# Patient Record
Sex: Female | Born: 1970 | Marital: Married | Smoking: Never smoker
Health system: Southern US, Community
[De-identification: ages and names within clinical notes are randomized; demographics above are authoritative.]

## PROBLEM LIST (undated history)

## (undated) DIAGNOSIS — F419 Anxiety disorder, unspecified: Secondary | ICD-10-CM

## (undated) DIAGNOSIS — R42 Dizziness and giddiness: Secondary | ICD-10-CM

## (undated) DIAGNOSIS — H9209 Otalgia, unspecified ear: Secondary | ICD-10-CM

## (undated) DIAGNOSIS — K529 Noninfective gastroenteritis and colitis, unspecified: Secondary | ICD-10-CM

## (undated) DIAGNOSIS — K589 Irritable bowel syndrome without diarrhea: Secondary | ICD-10-CM

## (undated) DIAGNOSIS — K2970 Gastritis, unspecified, without bleeding: Secondary | ICD-10-CM

## (undated) HISTORY — DX: Anxiety disorder, unspecified: F41.9

## (undated) HISTORY — DX: Noninfective gastroenteritis and colitis, unspecified: K52.9

## (undated) HISTORY — DX: Otalgia, unspecified ear: H92.09

---
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
## 2019-01-10 DIAGNOSIS — H6981 Other specified disorders of Eustachian tube, right ear: Secondary | ICD-10-CM | POA: Insufficient documentation

## 2019-01-10 DIAGNOSIS — M26623 Arthralgia of bilateral temporomandibular joint: Secondary | ICD-10-CM | POA: Insufficient documentation

## 2019-01-21 ENCOUNTER — Other Ambulatory Visit: Payer: Self-pay | Admitting: Otolaryngology

## 2019-01-21 DIAGNOSIS — H9201 Otalgia, right ear: Secondary | ICD-10-CM

## 2019-01-24 ENCOUNTER — Ambulatory Visit
Admission: RE | Admit: 2019-01-24 | Discharge: 2019-01-24 | Disposition: A | Discharge status: Home / Self Care | Payer: 59 | Source: Ambulatory Visit | Attending: Otolaryngology | Admitting: Otolaryngology

## 2019-01-24 ENCOUNTER — Other Ambulatory Visit: Payer: Self-pay

## 2019-01-24 DIAGNOSIS — H9201 Otalgia, right ear: Secondary | ICD-10-CM

## 2019-01-24 MED ORDER — IOPAMIDOL (ISOVUE-370) INJECTION 76%
75.0000 mL | Freq: Once | INTRAVENOUS | Status: AC | PRN
  Administered 2019-01-24: 75 mL via INTRAVENOUS

## 2019-01-31 DIAGNOSIS — F411 Generalized anxiety disorder: Secondary | ICD-10-CM | POA: Insufficient documentation

## 2019-02-01 ENCOUNTER — Other Ambulatory Visit: Payer: Self-pay

## 2019-02-01 ENCOUNTER — Ambulatory Visit (INDEPENDENT_AMBULATORY_CARE_PROVIDER_SITE_OTHER): Payer: 59 | Admitting: Neurology

## 2019-02-01 ENCOUNTER — Telehealth: Payer: Self-pay

## 2019-02-01 ENCOUNTER — Encounter: Payer: Self-pay | Admitting: Neurology

## 2019-02-01 DIAGNOSIS — H9201 Otalgia, right ear: Secondary | ICD-10-CM | POA: Diagnosis not present

## 2019-02-01 NOTE — Telephone Encounter (Signed)
I contacted the pt in regards to her video visit scheduled for 1:30 on 02/01/19.  Due to current COVID 19 pandemic, our office is severely reducing in office visits for at least the next 2 weeks, in order to minimize the risk to our patients and healthcare providers.    Pt understands that although there may be some limitations with this type of visit, we will take all precautions to reduce any security or privacy concerns.  Pt understands that this will be treated like an in office visit and we will file with pt's insurance, and there may be a patient responsible charge related to this service.  Pt's email is [email or web address]. Pt understands that the [employer] webex software must be downloaded and operational on the device pt plans to use for the visit.   I completed the pre charting for visit.

## 2019-02-01 NOTE — Progress Notes (Signed)
Virtual Visit via Video Note  I connected with Chelsea Bradshaw on 02/01/19 at  1:30 PM EDT by a video enabled telemedicine application and verified that I am speaking with the correct person using two identifiers.   I discussed the limitations of evaluation and management by telemedicine and the availability of in person appointments. The patient expressed understanding and agreed to proceed.  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  The patient has had onset around 05 September 2018 of right-sided ear pain with some itching and rale sensations going down the throat on that side.  The patient has undergone an extensive work-up in the past that includes MRI of the brain and MRI of the cervical spine that were unremarkable.  The patient has had a tympanogram that was normal, the possibility of eustachian tube dysfunction was entertained.  The patient does have some clicking in the temporomandibular joints bilaterally and it is felt that she may have some TMJ dysfunction issues.  The patient reports that she does not have increased pressure and pain in the right ear and throat with talking or chewing.  She denies any neck pain or neck discomfort.  She has not had any change in hearing or any changes in speech or swallowing.  She sometimes feels as if something is stuck in her throat.  She denies any vision changes.  She has not had any numbness or weakness of the face, arms, or legs.  She denies any balance issues or difficulty controlling the bowels or the bladder.  Within the last 3 days she was started on amitriptyline and clonazepam, she believes that she is already getting some benefit with this combination.  She does have gastroesophageal reflux disease however.  The patient did undergo CT angiogram of the head and neck done by Dr. Polly Cobia, this was unremarkable.    Observations/Objective: The WebEx evaluation reveals that the patient is alert and cooperative, she is answering questions appropriately.  Full extraocular movements were seen.  The patient has a symmetric smile, is able to protrude the tongue in the midline.  She seems to be able to open her mouth normally.  Full range of movement of the cervical spine was noted.  The patient is able to perform finger-nose-finger and heel-to-shin bilaterally.  Gait is normal.  Tandem gait is normal.  Romberg is negative.  Assessment and Plan: 1.  Right ear and throat discomfort  The etiology of the ear discomfort is not clear, the patient has had negative evaluation of the head and neck.  She does report clicking in the temporomandibular joints and may have TMJ dysfunction, but the description of her pain and distribution of pain is somewhat atypical for this.  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  The use of amitriptyline is appropriate at this time, if she cannot tolerate this secondary to gastroesophageal reflux disease we may switch to gabapentin.  The patient will follow-up here in 4 months, she will call me in 2 weeks to go of her progress.  Follow Up Instructions: Follow-up with me in 4 months.   I discussed the assessment and treatment plan with the patient. The patient was provided an opportunity to ask questions and all were answered. The patient agreed with the plan and demonstrated an understanding of the instructions.   The  patient was advised to call back or seek an in-person evaluation if the symptoms worsen or if the condition fails to improve as anticipated.  I provided 30 minutes of non-face-to-face time during this encounter.   Kathrynn Ducking, MD

## 2019-02-09 NOTE — Telephone Encounter (Addendum)
Patient send a my chart message on 02/05/19 and 02/08/19 asking for Dr. Jannifer Franklin to review. Pt was advised on 02/07/19 Dr. Jannifer Franklin would be out of the clinic until 02/09/19.

## 2019-02-10 ENCOUNTER — Other Ambulatory Visit: Payer: Self-pay | Admitting: Neurology

## 2019-02-10 MED ORDER — GABAPENTIN 100 MG PO CAPS: 100.0000 mg | ORAL_CAPSULE | Freq: Three times a day (TID) | ORAL | 3 refills | Status: DC

## 2019-02-18 DIAGNOSIS — F418 Other specified anxiety disorders: Secondary | ICD-10-CM | POA: Insufficient documentation

## 2019-02-18 DIAGNOSIS — K589 Irritable bowel syndrome without diarrhea: Secondary | ICD-10-CM | POA: Insufficient documentation

## 2019-02-23 ENCOUNTER — Encounter (HOSPITAL_COMMUNITY): Payer: Self-pay

## 2019-02-23 ENCOUNTER — Other Ambulatory Visit: Payer: Self-pay

## 2019-02-23 ENCOUNTER — Telehealth: Payer: Self-pay | Admitting: Neurology

## 2019-02-23 ENCOUNTER — Emergency Department (HOSPITAL_COMMUNITY)
Admission: EM | Admit: 2019-02-23 | Discharge: 2019-02-23 | Disposition: A | Discharge status: Home / Self Care | Payer: 59 | Attending: Emergency Medicine | Admitting: Emergency Medicine

## 2019-02-23 DIAGNOSIS — R112 Nausea with vomiting, unspecified: Secondary | ICD-10-CM

## 2019-02-23 DIAGNOSIS — Z79899 Other long term (current) drug therapy: Secondary | ICD-10-CM | POA: Diagnosis not present

## 2019-02-23 DIAGNOSIS — K589 Irritable bowel syndrome without diarrhea: Secondary | ICD-10-CM | POA: Insufficient documentation

## 2019-02-23 DIAGNOSIS — R1084 Generalized abdominal pain: Secondary | ICD-10-CM | POA: Insufficient documentation

## 2019-02-23 HISTORY — DX: Gastritis, unspecified, without bleeding: K29.70

## 2019-02-23 HISTORY — DX: Irritable bowel syndrome, unspecified: K58.9

## 2019-02-23 MED ORDER — DIPHENHYDRAMINE HCL 25 MG PO TABS: 25.0000 mg | ORAL_TABLET | Freq: Four times a day (QID) | ORAL | 0 refills | Status: DC | PRN

## 2019-02-23 MED ORDER — DIPHENHYDRAMINE HCL 50 MG/ML IJ SOLN
25.0000 mg | Freq: Once | INTRAMUSCULAR | Status: AC
  Administered 2019-02-23: 25 mg via INTRAVENOUS
  Filled 2019-02-23: qty 1

## 2019-02-23 MED ORDER — METOCLOPRAMIDE HCL 5 MG/ML IJ SOLN
10.0000 mg | Freq: Once | INTRAMUSCULAR | Status: AC
  Administered 2019-02-23: 10 mg via INTRAVENOUS
  Filled 2019-02-23: qty 2

## 2019-02-23 MED ORDER — SODIUM CHLORIDE 0.9 % IV BOLUS
1000.0000 mL | Freq: Once | INTRAVENOUS | Status: AC
  Administered 2019-02-23: 1000 mL via INTRAVENOUS

## 2019-02-23 MED ORDER — METOCLOPRAMIDE HCL 10 MG PO TABS: 10.0000 mg | ORAL_TABLET | Freq: Four times a day (QID) | ORAL | 0 refills | Status: DC | PRN

## 2019-02-23 NOTE — ED Triage Notes (Signed)
Pt has been experiencing ongoing nausea, bloating and indigestion for one month and is being treated for this by her PCP.   Last night pt developed generalized abd pain after administering a phenergan suppository.  Pt reports not having any pain with this before.  Pt has been treated for H-pylori 3 times and states that she "felt worse" after treatment.  Last treatment for H-pylori was 2018 and recent test yesterday was negative.

## 2019-02-23 NOTE — Discharge Instructions (Signed)
Stop taking the phenergan. Continue the pepcid, lexapro and amitriptyline. Begin taking the reglan/metoclopramide every 6 hours as needed for nausea.  Take benadryl with this medication. Attend your appointment tomorrow with the GI specialist.

## 2019-02-23 NOTE — Telephone Encounter (Signed)
Called pts x2 to schedule f/u no VM set up at this time

## 2019-02-23 NOTE — ED Provider Notes (Signed)
[facility] EMERGENCY DEPARTMENT Provider Note   CSN: [id number] Arrival date & time: 02/23/19  1016    History   Chief Complaint Chief Complaint  Patient presents with  . Abdominal Pain    HPI Chelsea Bradshaw is a 48 y.o. female with past medical history of gastritis, IBS, anxiety, presenting to the emergency department with complaint of ongoing nausea and vomiting.  Patient has longstanding history of gastritis and IBS.  She states she was previously treated in [place], from which she just moved to the [place] in the end of February.  She has a new GI appointment tomorrow and has been followed by her PCP.  Over the past month, her PCP has been changing around medications for her symptoms, including a trial of Prevacid, Pepcid, and is currently taking Phenergan suppositories.  She is also on Lexapro, per PCP documentation it is felt that there is a large anxiety component to her abdominal symptoms.  Patient has history of H. pylori, however negative test this month on the 13th.  She also had labs done 2 days ago by PCP and are unremarkable.  She states the Phenergan has not been helping enough.  She is constantly nauseous with intermittent episodes of nonbloody nonbilious emesis, episode of emesis was >24 hours ago.  No coffee-ground emesis.  Yesterday she developed a generalized intermittent crampy abdominal pain which is rather new for her.  She feels bloated and gassy. She reports today for symptom control.     The history is provided by the patient and medical records.    Past Medical History:  Diagnosis Date  . Ear pain    right some times in the left   . Gastritis   . IBS (irritable bowel syndrome)     There are no active problems to display for this patient.   Past Surgical History:  Procedure Laterality Date  . CESAREAN SECTION       OB History   No obstetric history on file.      Home Medications    Prior to Admission medications    Medication Sig Start Date End Date Taking? Authorizing Provider  amitriptyline (ELAVIL) 10 MG tablet  01/27/19   [provider]  clonazePAM (KLONOPIN) 0.5 MG tablet 0.5 mg 3 daily 01/25/19   [provider]  diphenhydrAMINE (BENADRYL) 25 MG tablet Take 1 tablet (25 mg total) by mouth every 6 (six) hours as needed (take with metoclopramide/reglan). 02/23/19   Leonor Darnell, [place] N, PA-C  famotidine (PEPCID) 20 MG tablet Take by mouth. 01/24/19   [provider]  gabapentin (NEURONTIN) 100 MG capsule Take 1 capsule (100 mg total) by mouth 3 (three) times daily. 02/10/19   Kathrynn Ducking, MD  metoCLOPramide (REGLAN) 10 MG tablet Take 1 tablet (10 mg total) by mouth every 6 (six) hours as needed for nausea or vomiting. 02/23/19   Kloey Cazarez, [place] N, PA-C  ondansetron (ZOFRAN-ODT) 4 MG disintegrating tablet TAKE ONE TABLET (4 MG DOSE) BY MOUTH EVERY 8 (EIGHT) HOURS AS NEEDED FOR NAUSEA FOR UP TO 7 DAYS. 01/24/19   [provider]    Family History Family History  Problem Relation Age of Onset  . Healthy Mother   . Healthy Father     Social History Social History   Tobacco Use  . Smoking status: Never Smoker  . Smokeless tobacco: Never Used  Substance Use Topics  . Alcohol use: Never    Frequency: Never  . Drug use: Never  Allergies   Patient has no known allergies.   Review of Systems Review of Systems  Constitutional: Negative for fever.  Gastrointestinal: Positive for abdominal pain, nausea and vomiting. Negative for diarrhea.  Genitourinary: Negative for dysuria and frequency.  All other systems reviewed and are negative.    Physical Exam Updated Vital Signs BP 101/73 (BP Location: Right Arm)   Pulse 82   Temp 97.8 F (36.6 C) (Oral)   Resp 18   Ht 5\' 3"  (1.6 m)   Wt 56.2 kg   LMP 02/17/2019 (Exact Date)   SpO2 100%   BMI 21.97 kg/m   Physical Exam Vitals signs and nursing note reviewed.  Constitutional:      General: She is  not in acute distress.    Appearance: She is well-developed.  HENT:     Head: Normocephalic and atraumatic.     Mouth/Throat:     Mouth: Mucous membranes are moist.  Eyes:     Conjunctiva/sclera: Conjunctivae normal.  Cardiovascular:     Rate and Rhythm: Normal rate and regular rhythm.  Pulmonary:     Effort: Pulmonary effort is normal. No respiratory distress.     Breath sounds: Normal breath sounds.  Abdominal:     General: Bowel sounds are normal.     Palpations: Abdomen is soft. There is no mass.     Tenderness: There is no abdominal tenderness. There is no guarding or rebound.     Comments: Abdomen appears very slightly distended.  Skin:    General: Skin is warm.  Neurological:     Mental Status: She is alert.  Psychiatric:        Behavior: Behavior normal.      ED Treatments / Results  Labs (all labs ordered are listed, but only abnormal results are displayed) Labs Reviewed - No data to display  EKG None  Radiology No results found.  Procedures Procedures (including critical care time)  Medications Ordered in ED Medications  sodium chloride 0.9 % bolus 1,000 mL (0 mLs Intravenous Stopped 02/23/19 1213)  metoCLOPramide (REGLAN) injection 10 mg (10 mg Intravenous Given 02/23/19 1118)  diphenhydrAMINE (BENADRYL) injection 25 mg (25 mg Intravenous Given 02/23/19 1117)     Initial Impression / Assessment and Plan / ED Course  I have reviewed the triage vital signs and the nursing notes.  Pertinent labs & imaging results that were available during my care of the patient were reviewed by me and considered in my medical decision making (see chart for details).       Patient with ongoing history of gastritis and IBS, with current 1 month long episode of nausea and vomiting with heartburn.  Patient presenting today with persistent nausea and intermittent vomiting, as well as generalized abdominal cramping/gas pains.  Patient presents today for symptom relief.  She  does have a GI appointment tomorrow.  No fevers.  Abdomen is soft and nontender on exam.  Vital signs are stable.  Labs done by PCP 2 days ago are unremarkable.  Patient also had negative H. pylori test done this month.  At this time will provide IV hydration with Reglan and Benadryl for symptom relief.  On evaluation, patient reports improvement in symptoms.  Nausea is resolved.  Will discharge with prescription for Reglan, instructed to discontinue Phenergan.  Instructed patient to attend her appointment tomorrow for further evaluation of her ongoing symptoms.  Low suspicion for acute intra-abdominal pathology given reassuring exam and recent normal outpatient work-up.  Discharge with strict return  precautions.  Agreeable to plan and safe for discharge.  Discussed results, findings, treatment and follow up. Patient advised of return precautions. Patient verbalized understanding and agreed with plan.  Final Clinical Impressions(s) / ED Diagnoses   Final diagnoses:  Generalized abdominal pain  Non-intractable vomiting with nausea, unspecified vomiting type    ED Discharge Orders         Ordered    metoCLOPramide (REGLAN) 10 MG tablet  Every 6 hours PRN     02/23/19 1232    diphenhydrAMINE (BENADRYL) 25 MG tablet  Every 6 hours PRN     02/23/19 1232           Jaskarn Schweer, [place] N, PA-C 02/23/19 1433    Carmin Muskrat, MD 02/23/19 1754

## 2019-02-24 ENCOUNTER — Ambulatory Visit (INDEPENDENT_AMBULATORY_CARE_PROVIDER_SITE_OTHER): Payer: 59 | Admitting: Gastroenterology

## 2019-02-24 ENCOUNTER — Encounter: Payer: Self-pay | Admitting: Gastroenterology

## 2019-02-24 VITALS — Ht 63.0 in | Wt 120.0 lb

## 2019-02-24 DIAGNOSIS — R1084 Generalized abdominal pain: Secondary | ICD-10-CM

## 2019-02-24 DIAGNOSIS — F411 Generalized anxiety disorder: Secondary | ICD-10-CM | POA: Diagnosis not present

## 2019-02-24 DIAGNOSIS — R112 Nausea with vomiting, unspecified: Secondary | ICD-10-CM | POA: Diagnosis not present

## 2019-02-24 DIAGNOSIS — R140 Abdominal distension (gaseous): Secondary | ICD-10-CM

## 2019-02-24 MED ORDER — DICYCLOMINE HCL 10 MG PO CAPS: 10.0000 mg | ORAL_CAPSULE | Freq: Three times a day (TID) | ORAL | 3 refills | Status: DC | PRN

## 2019-02-24 MED ORDER — SUCRALFATE 1 G PO TABS: 1.0000 g | ORAL_TABLET | Freq: Four times a day (QID) | ORAL | 2 refills | Status: DC | PRN

## 2019-02-24 NOTE — Patient Instructions (Addendum)
If you are age 48 or older, your body mass index should be between 23-30. Your Body mass index is 21.26 kg/m. If this is out of the aforementioned range listed, please consider follow up with your Primary Care Provider.  If you are age 7 or younger, your body mass index should be between 19-25. Your Body mass index is 21.26 kg/m. If this is out of the aformentioned range listed, please consider follow up with your Primary Care Provider.   To help prevent the possible spread of infection to our patients, communities, and staff; we will be implementing the following measures:  As of now we are not allowing any visitors/family members to accompany you to any upcoming appointments with [facility]. If you have any concerns about this please contact our office to discuss prior to the appointment.   Take phenergan 12.'5mg'$  three times a day for Nausea.  We have sent the following medications to your pharmacy for you to pick up at your convenience:: Carafate: Take 1 tablet by mouth, every 6 hours as needed  Bentyl: Take every 8 hours as needed  Continue Pepcid.      You have been scheduled for a CT scan of the abdomen and pelvis at [facility] located at [address] You will need to pick up 2 bottles of contrast next week between the hours of 8:00am and 5:00pm.  You are scheduled for your CT on Friday, 03-04-19 at 10:00am. You should arrive 20 minutes prior to your appointment time for registration. Please follow the written instructions below on the day of your exam:  WARNING: IF YOU ARE ALLERGIC TO IODINE/X-RAY DYE, PLEASE NOTIFY RADIOLOGY IMMEDIATELY AT [phone]! YOU WILL BE GIVEN A 13 HOUR PREMEDICATION PREP.  1) Do not eat anything after 6:00am (4 hours prior to your test) 2) You need to pick up 2 bottles of oral contrast to drink. The solution may taste better if refrigerated, but do NOT add ice or any other liquid to this solution. Shake well before  drinking.    Drink 1 bottle of contrast @ 8:00am (2 hours prior to your exam)  Drink 1 bottle of contrast @ 9:00am (1 hour prior to your exam)  You may take any medications as prescribed with a small amount of water, if necessary. If you take any of the following medications: METFORMIN, GLUCOPHAGE, GLUCOVANCE, AVANDAMET, RIOMET, FORTAMET, [place] MET, JANUMET, GLUMETZA or METAGLIP, you MAY be asked to HOLD this medication 48 hours AFTER the exam.  The purpose of you drinking the oral contrast is to aid in the visualization of your intestinal tract. The contrast solution may cause some diarrhea. Depending on your individual set of symptoms, you may also receive an intravenous injection of x-ray contrast/dye.   This test typically takes 30-45 minutes to complete.  If you have any questions regarding your exam or if you need to reschedule, you may call [facility] at [phone] between the hours of 8:00 am and 5:00 pm, Monday-Friday.  _______________________________________________________________   Thank you for entrusting me with your care and for choosing [facility], Dr. [place] Cellar

## 2019-02-24 NOTE — Progress Notes (Signed)
Virtual Visit via Video Note  I connected with Chelsea Bradshaw on 02/24/19 at  3:00 PM EDT by a video enabled telemedicine application and verified that I am speaking with the correct person using two identifiers.   I discussed the limitations of evaluation and management by telemedicine and the availability of in person appointments. The patient expressed understanding and agreed to proceed.  THIS ENCOUNTER IS A VIRTUAL VISIT DUE TO COVID-19 - PATIENT WAS NOT SEEN IN THE OFFICE. PATIENT HAS CONSENTED TO VIRTUAL VISIT / TELEMEDICINE VISIT USING DOXIMITY   Location of patient: home Location of provider: office Name of referring provider: self referred Persons participating: myself, patient   HPI :  48 y/o female here for a new patient visit regarding nausea / vomiting / abdominal pain, self referred.  She reports since March she has had some problems with her stomach, worse in the past week or so.   She feels severely nausea, bloating in her abdomen diffusely. She had 2 episodes of vomiting when she awoke last week although usually does not vomit. She has poor appetite. Nausea is worst in the morning, slightly better in the evening. She has a burning sensation in the epigastric area, some pyrosis with this. No dysphagia but some discomfort in the epigastric area after eating.  She reports she has some hard stools and loose stools, goes back and forth between these. She reports she saw some "white chaulk" in her stools, denies anything that looked like a parasite.   She reports she has prolonged episodes of these symptoms in the past. She moved from [place] about a 1 month ago. She reports she had similar symptoms in 2017, had H pylori diagnosed, and got worse with therapy of H pylori. In 2018 she moved to [place] had similar symptoms at that time. She had another endoscopy and found H pylori again and was treated. Another episode occured in 2019 with similar symptoms, but this time she  feels much worse in regards to her nausea. She has had a prior CT scan in 2017 which looked okay from her report. She has had many ultrasounds in [place] which she does not think showed any gallstones, but perhaps a small gallbladder polyp? It was not thought the gallbladder was causing symptoms.   Nausea, postprandial discomfort in the upper abdomen, bloating is what bothers her the most intermittently. She does not feel improvement with a bowel movement. No blood in the stools.   No NSAIDs. She has been on both phenergan and Zofran without benefit but taking PRN. She was seen in the ED last night, given fluids and reglan which she states did not help and made her feel worse. She states antibiotics for H pylori in the past made her feel worse. She had an H pylori breath test done recently which was negative. CBC, LFTs, BMET, all normal.   She has significant anxiety. She has lived here about a month or so due to husband's job. She states she usually Lexapro has made her feel better in the past. She has been started on Lexapro a few days ago, 10mg , as well as Pepcid. Prevacid and other PPIs have made her feel worse. In between these prolonged episodes she has no symptoms at all. She thinks she has lost about 1 kg thus far over the past month.   She recently has been taking Lexapro, Elavil, klonopin. She thinks the klonopin does help. Also taking pepcid. She does remotely endorse that domperidone given to her  in the past may have helped. She is not sure if she has had a prior gastric emptying study.  H  Breath test negative 02/07/19  She has had a colonoscopy in 2017 which was normal  She is on Elavil for which she takes 10mg  for ear pain which has been difficult to clarify what is driving this. She has seen numerous specialist for chronic ear pain, has had multiple MRIs and CTA of the neck which has been negative. She denies any vertigo     Past Medical History:  Diagnosis Date   Ear pain      right some times in the left    Gastritis    IBS (irritable bowel syndrome)      Past Surgical History:  Procedure Laterality Date   CESAREAN SECTION     Family History  Problem Relation Age of Onset   Healthy Mother    Healthy Father    Social History   Tobacco Use   Smoking status: Never Smoker   Smokeless tobacco: Never Used  Substance Use Topics   Alcohol use: Never    Frequency: Never   Drug use: Never   Current Outpatient Medications  Medication Sig Dispense Refill   amitriptyline (ELAVIL) 10 MG tablet      clonazePAM (KLONOPIN) 0.5 MG tablet 0.5 mg 3 daily     escitalopram (LEXAPRO) 10 MG tablet Take by mouth.     famotidine (PEPCID) 20 MG tablet Take by mouth.     ondansetron (ZOFRAN-ODT) 4 MG disintegrating tablet TAKE ONE TABLET (4 MG DOSE) BY MOUTH EVERY 8 (EIGHT) HOURS AS NEEDED FOR NAUSEA FOR UP TO 7 DAYS.     diphenhydrAMINE (BENADRYL) 25 MG tablet Take 1 tablet (25 mg total) by mouth every 6 (six) hours as needed (take with metoclopramide/reglan). (Patient not taking: Reported on 02/24/2019) 30 tablet 0   gabapentin (NEURONTIN) 100 MG capsule Take 1 capsule (100 mg total) by mouth 3 (three) times daily. (Patient not taking: Reported on 02/24/2019) 90 capsule 3   metoCLOPramide (REGLAN) 10 MG tablet Take 1 tablet (10 mg total) by mouth every 6 (six) hours as needed for nausea or vomiting. (Patient not taking: Reported on 02/24/2019) 30 tablet 0   promethazine (PHENERGAN) 25 MG suppository Place rectally.     No current facility-administered medications for this visit.    No Known Allergies   Review of Systems: All systems reviewed and negative except where noted in HPI.    No results found.  Physical Exam: Ht 5\' 3"  (1.6 m)    Wt 120 lb (54.4 kg)    LMP 02/17/2019 (Exact Date)    BMI 21.26 kg/m  NA   ASSESSMENT AND PLAN:  48 y/o female self referred her for a new patient visit regarding the following:  Nausea / abdominal  discomfort / bloating / anxiety - extensive history as above, patient with a prolonged episodes of severe nausea, abdominal discomfort / bloating during stressful times in her life over the past few years. She has had an extensive workup as above with multiple EGDs, colonoscopy, [place] of her abdomen, and one prior CT scan (all per report, I have not seen records of these - done in multiple countries). Apparently treated for H pylori a few times with eradication but has not helped symptoms at the time, she did have an H pylori breath test negative recently. She has recently moved back to the [place] in the past 2 months, living with daughter and husband,  endorses significant anxiety. I have spoken with Windell Hummingbird, PCP for this patient, about her case to Bradshaw some insight into what has been done and reviewed prior records. She appears to have severe anxiety, and I suspect she has an associated functional bowel disorder (dyspepsia) driving these symptoms, although gastroparesis is possible. Her labs and prior imaging have been reportedly normal in the setting of her symptoms. She inquires about doing another endoscopy, I think it will be low yield based on her prior workup. She asks about doing abdominal US, I think that would also be low yield, this does not sound biliary colic and her LFTs are normal, but will await results of her prior workup.   I discussed options with her and tried to provide some reassurance. I think using scheduled antiemetics to get on top of the nausea would be good, she will use 12.5mg  TID, she thinks she tolerates this okay. I offered her some bentyl to use PRN for her bloating / abdominal discomfort to see if that helps. I also offered some carafate to use PRN and she should continue the pepcid. She has reportedly felt worse on PPIs in the past. Ultimately she needs her anxiety treated which I think will help her dyspepsia. I think buspirone is a good option for her, although on low dose Lexapro  and Elavil. I spoke with Judson Roch about this and she will discuss starting buspirone next week and manage the psychogenic regimen. She would also benefit from behavioral health consult to manage this, Judson Roch will discuss this with her as well.   Moving forward, I would try these medications first and get control of anxiety and suspect her stomach will feel better. If we do any imaging or further testing, I would do a CT abdomen / pelvis with contrast as I don't think she has had cross sectional imaging of her abdomen in a few years, and would then consider a gastric emptying study. I think a negative CT would provide some additional reassurance and piece of mind for her.   We are awaiting the records from her work[place], patient will send to me when they arrive to confirm the reported history today. She can contact me in the interim with questions.   Thayer Cellar, MD [facility]  Total time 60 minutes (45 with patient) and additional time reviewing records and discussing with PA Weber.  CC: Mancel Bale, PA-C

## 2019-02-25 ENCOUNTER — Telehealth: Payer: Self-pay | Admitting: Gastroenterology

## 2019-02-25 ENCOUNTER — Encounter: Payer: Self-pay | Admitting: Gastroenterology

## 2019-02-25 NOTE — Telephone Encounter (Signed)
Patient states the Carafate tablets are too big and wants to know how to make it into a slurry. Informed patient to crush tablet and swirl with 4 oz of water and drink. Patient verbalized understanding.

## 2019-02-25 NOTE — Telephone Encounter (Signed)
Pt requested a call back regarding dicyclomine.

## 2019-02-25 NOTE — Telephone Encounter (Signed)
error 

## 2019-02-25 NOTE — Telephone Encounter (Signed)
Called patient with no answer and no voicemail to leave a message. 

## 2019-02-28 ENCOUNTER — Other Ambulatory Visit (INDEPENDENT_AMBULATORY_CARE_PROVIDER_SITE_OTHER): Payer: 59

## 2019-02-28 ENCOUNTER — Other Ambulatory Visit: Payer: Self-pay

## 2019-02-28 ENCOUNTER — Telehealth: Payer: Self-pay

## 2019-02-28 DIAGNOSIS — R1084 Generalized abdominal pain: Secondary | ICD-10-CM

## 2019-02-28 LAB — LIPASE: Lipase: 25 U/L (ref 11.0–59.0)

## 2019-02-28 LAB — AMYLASE: Amylase: 50 U/L (ref 27–131)

## 2019-02-28 MED ORDER — PANTOPRAZOLE SODIUM 40 MG PO TBEC: 40.0000 mg | DELAYED_RELEASE_TABLET | Freq: Every day | ORAL | 3 refills | Status: DC

## 2019-02-28 NOTE — Telephone Encounter (Signed)
Patient already has a CT scan scheduled on 03/04/19, which she has been notified about

## 2019-02-28 NOTE — Telephone Encounter (Signed)
Pt is calling in requesting another Video Visit with Dr Jannifer Franklin to discuss ear pain, I advised her there were no current opeings she wants to know if she can be placed some where in his schedule

## 2019-02-28 NOTE — Telephone Encounter (Signed)
I called the patient.  The patient has had ongoing discomfort around the ear and throat.  She does have some stomach upset and wonders if amitriptyline could be worsening this, amitriptyline can worsen reflux.  She claims that she went off of amitriptyline for 2 weeks and made no difference with her stomach.  The patient will try to go up to 20 mg at night of the amitriptyline.  She has not been on the gabapentin yet.

## 2019-02-28 NOTE — Telephone Encounter (Signed)
Labs put in epic and Protonix ordered. Patient called and asked to come in for labs to be drawn Mon.-Fri. 7:30am-4:30pm. Also to pick-up Protonix at her pharmacy. Asked her to continue her other meds., per Dr. Havery Moros

## 2019-03-02 ENCOUNTER — Telehealth: Payer: Self-pay | Admitting: Gastroenterology

## 2019-03-02 ENCOUNTER — Telehealth: Payer: Self-pay

## 2019-03-02 NOTE — Telephone Encounter (Signed)
Spoke with patient on the phone and let her know we could not schedule a virtual visit again right now, since she just had one with Dr. Havery Moros a few days ago, and he needs to allow time to visit with other patients as well. I Asked patient to please get all her questions together and send [place] one message a day, and we would do our best to respond to her in a timely manner.

## 2019-03-02 NOTE — Telephone Encounter (Signed)
See phone note

## 2019-03-02 NOTE — Telephone Encounter (Signed)
Responding to patient's multiple questions on one telephone note

## 2019-03-03 ENCOUNTER — Other Ambulatory Visit: Payer: Self-pay

## 2019-03-03 ENCOUNTER — Telehealth: Payer: Self-pay

## 2019-03-03 DIAGNOSIS — R140 Abdominal distension (gaseous): Secondary | ICD-10-CM

## 2019-03-03 DIAGNOSIS — R1084 Generalized abdominal pain: Secondary | ICD-10-CM

## 2019-03-03 DIAGNOSIS — R112 Nausea with vomiting, unspecified: Secondary | ICD-10-CM

## 2019-03-03 NOTE — Telephone Encounter (Signed)
-----   Message from Yetta Flock, MD sent at 03/03/2019 12:31 PM EDT ----- Regarding: RE: CT denied Oh geez. This is a difficult situation, this patient has had significant anxiety about scheduling all of this.  If the insurance is requesting the ultrasound first, then I would order the US of the abdomen complete. Jan can you please let the patient know that her insurance has denied the CT scan, they are requesting [place] be done first, and help schedule this for her? Thanks much  ----- Message ----- From: Darden Dates Sent: 03/03/2019  11:18 AM EDT To: Yetta Flock, MD, Roetta Sessions, CMA Subject: CT denied                                      Hey Dr. Havery Moros, [facility] you and your family are well! This CT has been denied.    [address] 1 CT ABDOMEN and PELVIS; with contrast Denied Based on [employer] Abdomen Imaging Guidelines Section: AB 2.2 Abdominal Pain, we cannot approve this request. Your records show that you have abdominal pain. The reason this request cannot be approved is because: 1. A recent ultrasound of the abdomen is supported for the initial evaluation of abdominal pain. Ultrasound may help confirm the diagnosis or may help determine the most appropriate next imaging test. This study might be supported when recent ultrasounds have been performed that are technically limited or non-diagnostic. The clinical information provided does not describe these results and, therefore, the request is not indicated at this time.   Patient is scheduled for tomorrow.  Not sure if Jan is in the office or not.  I'm sure someone is covering if not.  Thanks, Amy

## 2019-03-03 NOTE — Telephone Encounter (Signed)
Thanks much 

## 2019-03-03 NOTE — Telephone Encounter (Signed)
Responded on separate My Chart message

## 2019-03-03 NOTE — Telephone Encounter (Signed)
Called [facility] and the soonest they can schedule her U/S is Tuesday, 5-19 at 9:00am.  To arrive at 8:40am. NPO after midnight.  [address], [address] [phone], #1, #5   Pt called and notified by Magdalene River, CMA. Letter sent through [facility] and mailed.

## 2019-03-04 ENCOUNTER — Other Ambulatory Visit: Payer: 59

## 2019-03-09 ENCOUNTER — Ambulatory Visit
Admission: RE | Admit: 2019-03-09 | Discharge: 2019-03-09 | Disposition: A | Discharge status: Home / Self Care | Payer: 59 | Source: Ambulatory Visit | Attending: Gastroenterology | Admitting: Gastroenterology

## 2019-03-09 ENCOUNTER — Telehealth: Payer: Self-pay | Admitting: Neurology

## 2019-03-09 DIAGNOSIS — R140 Abdominal distension (gaseous): Secondary | ICD-10-CM

## 2019-03-09 DIAGNOSIS — R1084 Generalized abdominal pain: Secondary | ICD-10-CM

## 2019-03-09 DIAGNOSIS — R112 Nausea with vomiting, unspecified: Secondary | ICD-10-CM

## 2019-03-09 NOTE — Telephone Encounter (Signed)
The patient is asked me to send our records to her dentist, Dr. Jonna Coup.  I will do so.

## 2019-03-15 ENCOUNTER — Other Ambulatory Visit: Payer: 59

## 2019-03-16 ENCOUNTER — Telehealth: Payer: Self-pay | Admitting: Gastroenterology

## 2019-03-16 NOTE — Telephone Encounter (Signed)
Tried returning call to patient, was unable to leave message- voicemail box was full.

## 2019-03-16 NOTE — Telephone Encounter (Signed)
Patient has questions about her medication. She would like to know if she should stop taking her med due to her not feeling nausea no more.

## 2019-03-17 ENCOUNTER — Telehealth: Payer: Self-pay

## 2019-03-17 NOTE — Telephone Encounter (Signed)
Message sent back to patient via My Chart.acknowledging her progress and suggesting Dr. Havery Moros can address the other generalized symptoms on her viritual visit with him next week

## 2019-03-17 NOTE — Telephone Encounter (Signed)
Tried returning call to , unable to leave message.

## 2019-03-17 NOTE — Telephone Encounter (Signed)
See combined note

## 2019-03-23 NOTE — Telephone Encounter (Signed)
Spoke with patient regarding her Ondansetron. I explained to patient that she did not have to take it everyday. It can be taken prn. She states that she understands and will keep telehealth visit with Dr. Havery Moros on 03/25/2019.

## 2019-03-24 ENCOUNTER — Encounter: Payer: Self-pay | Admitting: General Surgery

## 2019-03-24 ENCOUNTER — Ambulatory Visit: Payer: 59 | Admitting: Gastroenterology

## 2019-03-25 ENCOUNTER — Other Ambulatory Visit: Payer: Self-pay

## 2019-03-25 ENCOUNTER — Ambulatory Visit (INDEPENDENT_AMBULATORY_CARE_PROVIDER_SITE_OTHER): Payer: 59 | Admitting: Gastroenterology

## 2019-03-25 ENCOUNTER — Encounter: Payer: Self-pay | Admitting: Gastroenterology

## 2019-03-25 VITALS — Ht 63.0 in | Wt 110.0 lb

## 2019-03-25 DIAGNOSIS — R140 Abdominal distension (gaseous): Secondary | ICD-10-CM

## 2019-03-25 DIAGNOSIS — R110 Nausea: Secondary | ICD-10-CM

## 2019-03-25 DIAGNOSIS — F411 Generalized anxiety disorder: Secondary | ICD-10-CM | POA: Diagnosis not present

## 2019-03-25 NOTE — Progress Notes (Signed)
Virtual Visit via Video Note  I connected with Chelsea Bradshaw on 03/25/19 at  3:30 PM EDT by a video enabled telemedicine application and verified that I am speaking with the correct person using two identifiers.  I discussed the limitations of evaluation and management by telemedicine and the availability of in person appointments. The patient expressed understanding and agreed to proceed.  THIS ENCOUNTER IS A VIRTUAL VISIT DUE TO COVID-19 - PATIENT WAS NOT SEEN IN THE OFFICE. PATIENT HAS CONSENTED TO VIRTUAL VISIT / TELEMEDICINE VISIT VIA DOXIMITY   Location of patient: home Location of provider: office Persons participating: myself, patient  HPI :  48 y/o female here for a follow up visit. Please see her intake visit from 02/24/19. She has had issues with nausea / abdominal bloating, decreased appetite, in the setting of severe R ear pain and severe anxiety, for which she has had an extensive evaluation. She has had episodes of these symptoms in the past when living in [place] and [place], previously endorses having had EGDs, colonoscopy, CT scans, [place] without any pathology noted. I have yet to receive the records of these exams, she thinks she will have them for me next week.   She has had significant anxiety about her R ear pain. She has seen Neurology, ENT, dentist, and endodontist about her pain which is thought to be neuropathic. She has a pending consult with a TMJ specialist. She thinks when this bothers her she has anxiety which leads to severe nausea. She has not been vomiting. She otherwise has had fluctuating appetite. She has not weighed herself, not sure if she has lost weight. She has a lot of bloating and belching which can bother her. She has had alternating bowel habits of constipation and then some loose stools. Constipation has been bothering her more lately, she thinks due to Elavil use. No blood in the stools. She had an [place] of her abdomen since our last visit which was  normal. We had tried to do a CT scan but her insurance company would not approve it.   She has been the regimen as outlined below: Elavil 25mg  qHS Lexapro 15mg  Klonopin 1/2 tab qHS Pantoprazole 40mg  carafate PRN - stopped 2 days ago Phenergan PRN  Generally she has done better from a GI perspective since I have seen her. The nausea is less, she has not needed to use phenergan for the past 5 days or so. The bloating comes and goes at times. She has questions about what she should eat. She denies any pain at all right now. Appetite remains somewhat poor. She continues to have anxiety but thinks it is better on the lexapro and klonopin.   US abdomen 03/09/19 - normal  H  Breath test negative 02/07/19  She has had a colonoscopy in 2017 which was normal  Past Medical History:  Diagnosis Date   Anxiety    Ear pain    right some times in the left    Gastritis    IBS (irritable bowel syndrome)      Past Surgical History:  Procedure Laterality Date   CESAREAN SECTION     Family History  Problem Relation Age of Onset   Healthy Mother    Healthy Father    Social History   Tobacco Use   Smoking status: Never Smoker   Smokeless tobacco: Never Used  Substance Use Topics   Alcohol use: Never    Frequency: Never   Drug use: Never   Current  Outpatient Medications  Medication Sig Dispense Refill   amitriptyline (ELAVIL) 10 MG tablet      clonazePAM (KLONOPIN) 0.5 MG tablet 0.5 mg 3 daily     dicyclomine (BENTYL) 10 MG capsule Take 1 capsule (10 mg total) by mouth every 8 (eight) hours as needed for spasms. (Patient not taking: Reported on 03/24/2019) 30 capsule 3   diphenhydrAMINE (BENADRYL) 25 MG tablet Take 1 tablet (25 mg total) by mouth every 6 (six) hours as needed (take with metoclopramide/reglan). (Patient not taking: Reported on 02/24/2019) 30 tablet 0   escitalopram (LEXAPRO) 10 MG tablet Take by mouth.     famotidine (PEPCID) 20 MG tablet Take by mouth.       gabapentin (NEURONTIN) 100 MG capsule Take 1 capsule (100 mg total) by mouth 3 (three) times daily. (Patient not taking: Reported on 02/24/2019) 90 capsule 3   metoCLOPramide (REGLAN) 10 MG tablet Take 1 tablet (10 mg total) by mouth every 6 (six) hours as needed for nausea or vomiting. (Patient not taking: Reported on 02/24/2019) 30 tablet 0   ondansetron (ZOFRAN-ODT) 4 MG disintegrating tablet TAKE ONE TABLET (4 MG DOSE) BY MOUTH EVERY 8 (EIGHT) HOURS AS NEEDED FOR NAUSEA FOR UP TO 7 DAYS.     pantoprazole (PROTONIX) 40 MG tablet Take 1 tablet (40 mg total) by mouth daily. 90 tablet 3   promethazine (PHENERGAN) 25 MG suppository Place rectally.     sucralfate (CARAFATE) 1 g tablet Take 1 tablet (1 g total) by mouth every 6 (six) hours as needed. 60 tablet 2   No current facility-administered medications for this visit.    No Known Allergies   Review of Systems: All systems reviewed and negative except where noted in HPI.    US Abdomen Complete  Result Date: 03/09/2019 CLINICAL DATA:  Abdominal pain. EXAM: ABDOMEN ULTRASOUND COMPLETE COMPARISON:  None. FINDINGS: Gallbladder: No gallstones or wall thickening visualized. No sonographic Murphy sign noted by sonographer. Common bile duct: Diameter: 0.4 cm Liver: No focal lesion identified. Within normal limits in parenchymal echogenicity. Portal vein is patent on color Doppler imaging with normal direction of blood flow towards the liver. IVC: Evaluation was limited by overlying bowel gas. Pancreas: Visualized portion unremarkable. Spleen: Size and appearance within normal limits. Right Kidney: Length: 9.3. Echogenicity within normal limits. No mass or hydronephrosis visualized. Left Kidney: Length: 9.6. Echogenicity within normal limits. No mass or hydronephrosis visualized. Abdominal aorta: No aneurysm visualized. Other findings: None. IMPRESSION: No acute sonographic abnormality detected. No specific abnormality detected to explain the  patient's abdominal pain. Electronically Signed   By: Constance Holster M.D.   On: 03/09/2019 17:16    Physical Exam: Ht 5\' 3"  (1.6 m) Comment: pt provided over the phone   Wt 110 lb (49.9 kg) Comment: pt provided over the phone   BMI 19.49 kg/m   ASSESSMENT AND PLAN: 48 y/o female here for reassessment of the following issues:  Nausea / abdominal bloating / anxiety - I suspect she has a functional bowel disorder in the setting of severe anxiety. She has had an extensive workup [place] other countries with multiple EGDs, colonoscopy, [place], and CT scan of her abdomen. I have asked that she provide records of these exams so I can review and ensure everything is okay, she thinks she will receive the records next week. Main symptoms have been nausea and bloating. The nausea is significantly improved and not needing phenergan as much. She can continue phenergan as needed. She wishes to try  and taper down her protonix, will decrease to 20mg  q day and see how she does. I recommend a low FODMAP diet to see if that helps her bloating / bowels. In the setting of some constipation recommend Miralax once daily and titrate up as needed, hopefully that will help bloating as well. She has tried probiotics without benefit, would hold on that. If bloating persists, could try a course of rifaximin. She wanted to hold off on that for now as she thinks in general she is feeling a bit better. I think as anxiety becomes better controlled over time and she completes her evaluation for ear pain hopefully she continues to improve. Reassured her regarding normal labs and [place]. I will await records for her to drop off next week to confirm reports of her prior workup.   Joppa Cellar, MD [facility]

## 2019-03-28 ENCOUNTER — Other Ambulatory Visit: Payer: Self-pay

## 2019-03-28 DIAGNOSIS — G8929 Other chronic pain: Secondary | ICD-10-CM | POA: Insufficient documentation

## 2019-03-28 DIAGNOSIS — H9201 Otalgia, right ear: Secondary | ICD-10-CM | POA: Insufficient documentation

## 2019-03-28 MED ORDER — PROMETHAZINE HCL 25 MG RE SUPP: 25.0000 mg | Freq: Four times a day (QID) | RECTAL | 1 refills | Status: DC | PRN

## 2019-03-30 ENCOUNTER — Telehealth: Payer: Self-pay

## 2019-03-30 NOTE — Telephone Encounter (Signed)
Message   ok thank you Doc. Also doc can I request you to send your diagnoses and reports of my condition to [place] he is the TMJ Specialist in [place] , I have an appointment with him  On 8th June since doc [facility] wanted me to have that checked too. So sorry to bother you.

## 2019-03-30 NOTE — Telephone Encounter (Signed)
Done

## 2019-03-31 ENCOUNTER — Telehealth: Payer: Self-pay | Admitting: Neurology

## 2019-03-31 MED ORDER — AMITRIPTYLINE HCL 25 MG PO TABS: 50.0000 mg | ORAL_TABLET | Freq: Every day | ORAL | 2 refills | Status: DC

## 2019-03-31 NOTE — Telephone Encounter (Signed)
I called the patient.  The patient is on 25 mg amitriptyline, she is doing well with this and tolerating the drug.  She is still having a rough sensation and discomfort in the right part of the throat around the tonsil area.  The patient will be increased on her amitriptyline to 50 mg at night, if this is not effective in 2 weeks, she will contact me and we will consider switching over to gabapentin.

## 2019-03-31 NOTE — Telephone Encounter (Signed)
Pt is calling in requesting to speak with Dr Jannifer Franklin regarding her ear and throat symptoms, pt is wanting a call back

## 2019-04-04 ENCOUNTER — Ambulatory Visit: Payer: 59 | Admitting: Neurology

## 2019-04-04 ENCOUNTER — Encounter

## 2019-04-05 NOTE — Telephone Encounter (Signed)
I mailed the office note, they fax machine is down.

## 2019-04-05 NOTE — Telephone Encounter (Signed)
Pt sent a my chart message on 04/05/19 stating [facility] has not received the records we sent on 03/30/19. Pt confirmed # for Dr. Hali Marry is [id number]. Will fwd information to medical records for further investigation.

## 2019-04-05 NOTE — Telephone Encounter (Signed)
Noted thanks °

## 2019-04-06 ENCOUNTER — Other Ambulatory Visit: Payer: Self-pay | Admitting: Neurology

## 2019-04-06 MED ORDER — AMITRIPTYLINE HCL 10 MG PO TABS: ORAL_TABLET | ORAL | 1 refills | Status: DC

## 2019-04-25 ENCOUNTER — Other Ambulatory Visit: Payer: Self-pay | Admitting: Neurology

## 2019-04-25 MED ORDER — AMITRIPTYLINE HCL 10 MG PO TABS: 20.0000 mg | ORAL_TABLET | Freq: Every day | ORAL | 1 refills | Status: DC

## 2019-04-26 ENCOUNTER — Other Ambulatory Visit: Payer: Self-pay

## 2019-04-30 ENCOUNTER — Other Ambulatory Visit: Payer: Self-pay | Admitting: Neurology

## 2019-05-11 ENCOUNTER — Telehealth: Payer: Self-pay | Admitting: Gastroenterology

## 2019-05-11 NOTE — Telephone Encounter (Signed)
Patient records arrived, they were extensive and summary as below:   3 upper endoscopies between Aug 2017 and Oct 2018 - reportedly showed H pylori gastritis, has been treated 3 times for H pylori with eradication noted on 2 tests in 2019  EGD 03/05/2017 - H pylori gastritis, small bowel biopsies normal  Esophageal manometry 11/12/2017 - normal 24 hour pH test done OFF PPI - normal  US kidneys 12/09/2017 - normal  H pylori stool antigen test 03/12/2018 - negative  H pylori breath test 05/28/2018 - negative  Fecal calprotectin 07/01/2018 - 62  RUQ [place] 07/2018 - tiny 12mm gallbladder polyp, otherwise normal  Other MRIs / CT scans on disk I cannot open/ see  Patient has been doing well on current regimen, she will follow up with me PRN moving forward if symptoms recur

## 2019-05-21 ENCOUNTER — Other Ambulatory Visit: Payer: Self-pay | Admitting: Neurology

## 2019-06-22 ENCOUNTER — Encounter: Payer: Self-pay | Admitting: Neurology

## 2019-06-22 ENCOUNTER — Encounter

## 2019-06-22 ENCOUNTER — Telehealth (INDEPENDENT_AMBULATORY_CARE_PROVIDER_SITE_OTHER): Payer: 59 | Admitting: Neurology

## 2019-06-22 DIAGNOSIS — R070 Pain in throat: Secondary | ICD-10-CM

## 2019-06-22 NOTE — Progress Notes (Signed)
      Virtual Visit via Video Note  I connected with [name] on 06/22/19 at 11:00 AM EDT by a video enabled telemedicine application and verified that I am speaking with the correct person using two identifiers.  Location: Patient: The patient is at home. Provider: Physician is in office.   I discussed the limitations of evaluation and management by telemedicine and the availability of in person appointments. The patient expressed understanding and agreed to proceed.  History of Present Illness: Chelsea Bradshaw is a 48 year old right-handed [place] female with a history of episodic vertigo and some chronic issues with discomfort on the right throat.  The patient has recently had a bite guard made for grinding her teeth at night.  She has been on amitriptyline which has been of some help, she likely has a lot of underlying anxiety issues.  She is doing better with her sensations and throat, she occasionally will have some dry sensations between the right ear and throat that occur.  She denies any problems with swallowing or with speaking.  Overall, she is doing much better.  She is on 50 mg of amitriptyline at night, she does not take gabapentin.   Observations/Objective: The evaluation today reveals that the facial symmetry is present.  The patient has full extraocular movements.  Speech is blunted, not aphasic.  She has ability to protrude the tongue midline with good lateral movement of the tongue.  She has full range move the cervical spine.  She has good finger-nose-finger and heel shin bilaterally.  Gait is normal.  Tandem gait is normal.  Romberg is negative.  No drift is seen.  Assessment and Plan: 1.  Throat discomfort  The patient overall is doing quite well.  She will continue the amitriptyline for now, she is tolerating the drug but is gaining weight on the medication.  She will follow-up here in about 6 months.  Follow Up Instructions: 77-month follow-up, may see nurse  practitioner.   I discussed the assessment and treatment plan with the patient. The patient was provided an opportunity to ask questions and all were answered. The patient agreed with the plan and demonstrated an understanding of the instructions.   The patient was advised to call back or seek an in-person evaluation if the symptoms worsen or if the condition fails to improve as anticipated.  I provided 15 minutes of non-face-to-face time during this encounter.   Kathrynn Ducking, MD

## 2019-07-05 ENCOUNTER — Telehealth: Payer: Self-pay | Admitting: Neurology

## 2019-07-05 NOTE — Telephone Encounter (Signed)
I called patient regarding scheduling a 6 month follow-up with Judson Roch NP per Dr. Jannifer Franklin.

## 2019-07-12 DIAGNOSIS — E559 Vitamin D deficiency, unspecified: Secondary | ICD-10-CM | POA: Insufficient documentation

## 2019-07-12 DIAGNOSIS — D509 Iron deficiency anemia, unspecified: Secondary | ICD-10-CM | POA: Insufficient documentation

## 2019-07-19 ENCOUNTER — Ambulatory Visit (INDEPENDENT_AMBULATORY_CARE_PROVIDER_SITE_OTHER): Payer: 59 | Admitting: Neurology

## 2019-07-19 ENCOUNTER — Other Ambulatory Visit: Payer: Self-pay

## 2019-07-19 ENCOUNTER — Encounter: Payer: Self-pay | Admitting: Neurology

## 2019-07-19 VITALS — BP 105/80 | HR 91 | Ht 63.0 in | Wt 140.0 lb

## 2019-07-19 DIAGNOSIS — R070 Pain in throat: Secondary | ICD-10-CM | POA: Diagnosis not present

## 2019-07-19 DIAGNOSIS — R42 Dizziness and giddiness: Secondary | ICD-10-CM | POA: Diagnosis not present

## 2019-07-19 HISTORY — DX: Dizziness and giddiness: R42

## 2019-07-19 NOTE — Progress Notes (Signed)
Reason for visit: Vertigo, throat pain, arthralgias  Chelsea Bradshaw is a 48 y.o. female  History of present illness:  Chelsea Bradshaw is a 48 year old right-handed [place] female with a history of posttraumatic vertigo and a history of chronic right-sided throat pain.  The patient has been on amitriptyline taking 50 mg at night, she finds that this is quite helpful.  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  The patient has not yet been to an ophthalmologist regarding this.  She has not had any eye symptoms however on the medication.  She began having some new issues in June 2020.  She began having some arthralgias affecting the wrists and the joints of the fingers bilaterally with some occasional pain in the left foot.  The patient has more stiffness and discomfort in the morning when she first gets up out of bed.  She has been to her primary care physician who is ordered blood work showing a sedimentation rate of 57, rheumatoid factor and ANA are negative.  The patient does have an iron deficiency anemia and she is on iron supplementation and she is now on high-dose vitamin D supplementation for low vitamin D level.  Chemistry panel and liver profile were otherwise unremarkable.  The patient will have another check on the sedimentation rate in 1 month.  The patient also had some recurrence of vertigo last week, she had one episode while looking down, this has not recurred.  The patient knows how to do the Epley maneuvers but she has not yet done them.  The patient reports no falls, she has not had any new numbness or weakness of the face, arms, legs.  She returns to this office for further evaluation.  The patient may occasionally have some bilateral leg discomfort at nighttime.  Past Medical History:  Diagnosis Date  . Anxiety   . Ear pain    right some times in the left   . Gastritis   . IBS  (irritable bowel syndrome)     Past Surgical History:  Procedure Laterality Date  . CESAREAN SECTION      Family History  Problem Relation Age of Onset  . Healthy Mother   . Healthy Father     Social history:  reports that she has never smoked. She has never used smokeless tobacco. She reports that she does not drink alcohol or use drugs.  Medications:  Prior to Admission medications   Medication Sig Start Date End Date Taking? Authorizing Provider  amitriptyline (ELAVIL) 25 MG tablet TAKE 2 TABLETS (50 MG TOTAL) BY MOUTH AT BEDTIME. 05/03/19   Kathrynn Ducking, MD  clonazePAM (KLONOPIN) 0.5 MG tablet 0.5 mg 3 daily 01/25/19   [provider]  dicyclomine (BENTYL) 10 MG capsule Take 1 capsule (10 mg total) by mouth every 8 (eight) hours as needed for spasms. Patient not taking: Reported on 03/24/2019 02/24/19   Yetta Flock, MD  diphenhydrAMINE (BENADRYL) 25 MG tablet Take 1 tablet (25 mg total) by mouth every 6 (six) hours as needed (take with metoclopramide/reglan). Patient not taking: Reported on 02/24/2019 02/23/19   Robinson, [place] N, PA-C  escitalopram (LEXAPRO) 10 MG tablet Take by mouth. 02/14/19   [provider]  famotidine (PEPCID) 20 MG tablet Take by mouth. 01/24/19   [provider]  ondansetron (ZOFRAN-ODT) 4 MG disintegrating tablet TAKE ONE TABLET (4 MG DOSE) BY  MOUTH EVERY 8 (EIGHT) HOURS AS NEEDED FOR NAUSEA FOR UP TO 7 DAYS. 01/24/19   [provider]  pantoprazole (PROTONIX) 40 MG tablet Take 1 tablet (40 mg total) by mouth daily. 02/28/19   Armbruster, Carlota Raspberry, MD  promethazine (PHENERGAN) 25 MG suppository Place 1 suppository (25 mg total) rectally every 6 (six) hours as needed for nausea or vomiting. 03/28/19   Armbruster, Carlota Raspberry, MD  sucralfate (CARAFATE) 1 g tablet Take 1 tablet (1 g total) by mouth every 6 (six) hours as needed. 02/24/19   Armbruster, Carlota Raspberry, MD     No Known Allergies  ROS:  Out of a complete 14  system review of symptoms, the patient complains only of the following symptoms, and all other reviewed systems are negative.  Vertigo Joint pain Throat pain  Blood pressure 105/80, pulse 91, height 5\' 3"  (1.6 m), weight 140 lb (63.5 kg).  Physical Exam  General: The patient is alert and cooperative at the time of the examination.  Eyes: Pupils are equal, round, and reactive to light. Discs are flat bilaterally.  Neck: The neck is supple, no carotid bruits are noted.  Respiratory: The respiratory examination is clear.  Cardiovascular: The cardiovascular examination reveals a regular rate and rhythm, no obvious murmurs or rubs are noted.  Neuromuscular: Range move the cervical spine is full.  Skin: Extremities are without significant edema.  No joint swelling or warmth over the joints of the hands or wrists are noted.  Neurologic Exam  Mental status: The patient is alert and oriented x 3 at the time of the examination. The patient has apparent normal recent and remote memory, with an apparently normal attention span and concentration ability.  Cranial nerves: Facial symmetry is present. There is good sensation of the face to pinprick and soft touch bilaterally. The strength of the facial muscles and the muscles to head turning and shoulder shrug are normal bilaterally. Speech is well enunciated, no aphasia or dysarthria is noted. Extraocular movements are full. Visual fields are full. The tongue is midline, and the patient has symmetric elevation of the soft palate. No obvious hearing deficits are noted.  Motor: The motor testing reveals 5 over 5 strength of all 4 extremities. Good symmetric motor tone is noted throughout.  Sensory: Sensory testing is intact to pinprick, soft touch, vibration sensation, and position sense on all 4 extremities. No evidence of extinction is noted.  Coordination: Cerebellar testing reveals good finger-nose-finger and heel-to-shin bilaterally.  Gait  and station: Gait is normal. Tandem gait is normal. Romberg is negative. No drift is seen.  Reflexes: Deep tendon reflexes are symmetric and normal bilaterally. Toes are downgoing bilaterally.   Assessment/Plan:  1.  History of throat pain, etiology unclear  2.  New onset arthralgias of hands and feet  3.  Episodic vertigo  4.  Vitamin D deficiency  The patient is now on high-dose vitamin D supplementation.  Her primary care physician will be checking another sedimentation rate in the near future.  If the arthralgias continue, she may need to see a rheumatology physician in this area.  The patient apparently saw a rheumatologist in [place] before she moved to the [place].  If the vertigo recurs, the patient may need referral to vestibular rehabilitation.  She will follow-up here in 6 months, she will continue the amitriptyline for now.  Greater than 50% of the visit was spent in counseling and coordination of care.  Face-to-face time with the patient was 25 minutes.  Jill Alexanders MD 07/19/2019 7:32 AM  [facility] [address] [place], [place] [address]  Phone [phone] Fax [phone]

## 2019-11-28 ENCOUNTER — Other Ambulatory Visit: Payer: Self-pay

## 2019-11-28 MED ORDER — AMITRIPTYLINE HCL 50 MG PO TABS: 50.0000 mg | ORAL_TABLET | Freq: Every day | ORAL | 1 refills | Status: DC

## 2020-01-16 NOTE — Progress Notes (Signed)
PATIENT: [name] DOB: 31-Oct-1970  REASON FOR VISIT: follow up HISTORY FROM: patient  HISTORY OF PRESENT ILLNESS: Today 01/17/20  Ms. Smyth is a 49 year old [place] female with history of posttraumatic vertigo (2015) and chronic right-sided ear pain, throat pain (after fillings in her teeth 2019).  She also complains of some arthralgias affecting the hands.  She is on amitriptyline for the ear and throat sensations.  She has seen a rheumatologist with a normal report.  She indicates she has days with no pain, with weather change and rain, this will be a trigger for the pain to her ear and throat.  She denies difficulty swallowing.  She says she has gotten used to it, it does not keep her from doing anything. She reports weight gain with amitriptyline.  The vertigo comes and goes, she knows the Epley maneuvers, but does not do them.  She presents today for evaluation unaccompanied.  HISTORY 07/19/2019 Dr. Jannifer Franklin: Ms. Macgregor is a 49 year old right-handed [place] female with a history of posttraumatic vertigo and a history of chronic right-sided throat pain.  The patient has been on amitriptyline taking 50 mg at night, she finds that this is quite helpful.  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  The patient has not yet been to an ophthalmologist regarding this.  She has not had any eye symptoms however on the medication.  She began having some new issues in June 2020.  She began having some arthralgias affecting the wrists and the joints of the fingers bilaterally with some occasional pain in the left foot.  The patient has more stiffness and discomfort in the morning when she first gets up out of bed.  She has been to her primary care physician who is ordered blood work showing a sedimentation rate of 57, rheumatoid factor and ANA are negative.  The patient does have an iron deficiency anemia and  she is on iron supplementation and she is now on high-dose vitamin D supplementation for low vitamin D level.  Chemistry panel and liver profile were otherwise unremarkable.  The patient will have another check on the sedimentation rate in 1 month.  The patient also had some recurrence of vertigo last week, she had one episode while looking down, this has not recurred.  The patient knows how to do the Epley maneuvers but she has not yet done them.  The patient reports no falls, she has not had any new numbness or weakness of the face, arms, legs.  She returns to this office for further evaluation.  The patient may occasionally have some bilateral leg discomfort at nighttime.   REVIEW OF SYSTEMS: Out of a complete 14 system review of symptoms, the patient complains only of the following symptoms, and all other reviewed systems are negative.  Throat pain  ALLERGIES: No Known Allergies  HOME MEDICATIONS: Outpatient Medications Prior to Visit  Medication Sig Dispense Refill  . amitriptyline (ELAVIL) 50 MG tablet Take 1 tablet (50 mg total) by mouth at bedtime. 90 tablet 1  . clonazePAM (KLONOPIN) 0.5 MG tablet 0.5 mg 3 daily    . escitalopram (LEXAPRO) 5 MG tablet Take 5 mg by mouth daily.     No facility-administered medications prior to visit.    PAST MEDICAL HISTORY: Past Medical History:  Diagnosis Date  . Anxiety   . Ear pain    right some times in  the left   . Gastritis   . IBS (irritable bowel syndrome)   . Vertigo 07/19/2019    PAST SURGICAL HISTORY: Past Surgical History:  Procedure Laterality Date  . CESAREAN SECTION      FAMILY HISTORY: Family History  Problem Relation Age of Onset  . Healthy Mother   . Healthy Father     SOCIAL HISTORY: Social History   Socioeconomic History  . Marital status: Married    Spouse name: Not on file  . Number of children: 1  . Years of education: Not on file  . Highest education level: Not on file  Occupational History  . Not  on file  Tobacco Use  . Smoking status: Never Smoker  . Smokeless tobacco: Never Used  Substance and Sexual Activity  . Alcohol use: Never  . Drug use: Never  . Sexual activity: Not Currently  Other Topics Concern  . Not on file  Social History Narrative   Right handed    Caffeine 2 cups per day.    Social Determinants of Health   Financial Resource Strain:   . Difficulty of Paying Living Expenses:   Food Insecurity:   . Worried About Charity fundraiser in the Last Year:   . Arboriculturist in the Last Year:   Transportation Needs:   . Film/video editor (Medical):   Marland Kitchen Lack of Transportation (Non-Medical):   Physical Activity:   . Days of Exercise per Week:   . Minutes of Exercise per Session:   Stress:   . Feeling of Stress :   Social Connections:   . Frequency of Communication with Friends and Family:   . Frequency of Social Gatherings with Friends and Family:   . Attends Religious Services:   . Active Member of Clubs or Organizations:   . Attends Archivist Meetings:   Marland Kitchen Marital Status:   Intimate Partner Violence:   . Fear of Current or Ex-Partner:   . Emotionally Abused:   Marland Kitchen Physically Abused:   . Sexually Abused:       PHYSICAL EXAM  Vitals:   01/17/20 1541  BP: 111/76  Pulse: 90  Temp: 97.7 F (36.5 C)  Weight: 138 lb (62.6 kg)  Height: 5\' 3"  (1.6 m)   Body mass index is 24.45 kg/m.  Generalized: Well developed, in no acute distress   Neurological examination  Mentation: Alert oriented to time, place, history taking. Follows all commands speech and language fluent Cranial nerve II-XII: Pupils were equal round reactive to light. Extraocular movements were full, visual field were full on confrontational test. Facial sensation and strength were normal. Head turning and shoulder shrug  were normal and symmetric. Motor: The motor testing reveals 5 over 5 strength of all 4 extremities. Good symmetric motor tone is noted throughout.    Sensory: Sensory testing is intact to soft touch on all 4 extremities. No evidence of extinction is noted.  Coordination: Cerebellar testing reveals good finger-nose-finger and heel-to-shin bilaterally.  Gait and station: Gait is normal. Tandem gait is normal. Romberg is negative. No drift is seen.  Reflexes: Deep tendon reflexes are symmetric and normal bilaterally.   DIAGNOSTIC DATA (LABS, IMAGING, TESTING) - I reviewed patient records, labs, notes, testing and imaging myself where available.  No results found for: WBC, HGB, HCT, MCV, PLT No results found for: NA, K, CL, CO2, GLUCOSE, BUN, CREATININE, CALCIUM, PROT, ALBUMIN, AST, ALT, ALKPHOS, BILITOT, GFRNONAA, GFRAA No results found for: CHOL, HDL, LDLCALC,  LDLDIRECT, TRIG, CHOLHDL No results found for: HGBA1C No results found for: VITAMINB12 No results found for: TSH  ASSESSMENT AND PLAN 49 y.o. year old female  has a past medical history of Anxiety, Ear pain, Gastritis, IBS (irritable bowel syndrome), and Vertigo (07/19/2019). here with:  1.  History of throat pain, etiology unclear 2.  Episodic vertigo  The throat and ear pain comes and goes.  She wishes to try dose reduction of the amitriptyline, due to side effect of weight gain.  I will send in a prescription for the 10 mg capsules, she will take 40 mg at bedtime.  If she continues to do well after a few weeks, she may continue to drop the dose slowly.  She will contact me if questions with dose adjustment.  She will follow-up here in 6 months or sooner if needed.  I spent 20 minutes of face-to-face and non-face-to-face time with patient.  This included previsit chart review, lab review, study review, order entry, electronic health record documentation, patient education.   Butler Denmark, AGNP-C, DNP 01/17/2020, 5:04 PM [facility] [address], [place], [place] [address] [phone]

## 2020-01-17 ENCOUNTER — Ambulatory Visit (INDEPENDENT_AMBULATORY_CARE_PROVIDER_SITE_OTHER): Payer: BC Managed Care – PPO | Admitting: Neurology

## 2020-01-17 ENCOUNTER — Encounter: Payer: Self-pay | Admitting: Neurology

## 2020-01-17 ENCOUNTER — Other Ambulatory Visit: Payer: Self-pay

## 2020-01-17 VITALS — BP 111/76 | HR 90 | Temp 97.7°F | Ht 63.0 in | Wt 138.0 lb

## 2020-01-17 DIAGNOSIS — R070 Pain in throat: Secondary | ICD-10-CM | POA: Diagnosis not present

## 2020-01-17 DIAGNOSIS — R42 Dizziness and giddiness: Secondary | ICD-10-CM

## 2020-01-17 MED ORDER — AMITRIPTYLINE HCL 10 MG PO TABS: 40.0000 mg | ORAL_TABLET | Freq: Every day | ORAL | 6 refills | Status: DC

## 2020-01-17 NOTE — Patient Instructions (Signed)
Try decrease in dose of amitriptyline 40 mg at bedtime, if pain increases we can go back up, we can continue to decrease the dose if you do well

## 2020-01-19 NOTE — Progress Notes (Signed)
I have read the note, and I agree with the clinical assessment and plan.  Charles K Willis   

## 2020-02-10 ENCOUNTER — Other Ambulatory Visit: Payer: Self-pay | Admitting: Neurology

## 2020-02-10 MED ORDER — AMITRIPTYLINE HCL 10 MG PO TABS: 40.0000 mg | ORAL_TABLET | Freq: Every day | ORAL | 3 refills | Status: DC

## 2020-05-14 ENCOUNTER — Telehealth: Payer: Self-pay | Admitting: Neurology

## 2020-05-14 NOTE — Telephone Encounter (Signed)
I called the patient, left a message, I will call back later. 

## 2020-05-14 NOTE — Telephone Encounter (Signed)
Pt requested to speak with provider, did not offer any information. Pt wanted to be seen today in person or virtual visit. Please advise.

## 2020-05-14 NOTE — Telephone Encounter (Signed)
I called the patient. The patient has had increased symptoms with changes in the weather, she has had increased throat pain and some tingling on the face. She claims of the tingling sensations are new, she has had itching sensations previously.  She had an MRI of the brain done previously in [place], not in the [place].  She is on amitriptyline taking 20 mg at night. She will see her primary doctor tomorrow, if need be we can add gabapentin to the amitriptyline.  She wishes to have a earlier work and, I will try to get something set up. We may consider a repeat MRI of the brain in the future.

## 2020-05-16 NOTE — Telephone Encounter (Signed)
Appt made

## 2020-05-16 NOTE — Telephone Encounter (Signed)
LVM called to try to set up appt with Dr Jannifer Franklin

## 2020-05-18 ENCOUNTER — Ambulatory Visit (INDEPENDENT_AMBULATORY_CARE_PROVIDER_SITE_OTHER): Payer: BC Managed Care – PPO | Admitting: Neurology

## 2020-05-18 ENCOUNTER — Encounter: Payer: Self-pay | Admitting: Neurology

## 2020-05-18 VITALS — BP 118/82 | HR 95 | Ht 63.0 in | Wt 134.0 lb

## 2020-05-18 DIAGNOSIS — M542 Cervicalgia: Secondary | ICD-10-CM | POA: Diagnosis not present

## 2020-05-18 DIAGNOSIS — R070 Pain in throat: Secondary | ICD-10-CM | POA: Diagnosis not present

## 2020-05-18 DIAGNOSIS — R202 Paresthesia of skin: Secondary | ICD-10-CM

## 2020-05-18 NOTE — Progress Notes (Signed)
Reason for visit: Throat pain, facial paresthesias  Chelsea Bradshaw is an 49 y.o. female  History of present illness:  Chelsea Bradshaw is a 49 year old right-handed [place] female with a history of onset of right throat discomfort following a dental procedure a year and a half ago.  The patient underwent extensive evaluation in [place] with MRI of the brain, cervical spine, and MRI of the internal auditory canal.  The patient had no identifiable lesions.  The patient has had some intermittent neck stiffness as well and episodic vertigo that appears to be positional in nature.  The patient went to a osteopathic physician and underwent treatments of the neck which seem to help her vertigo.  The patient has had ongoing burning sensations in the throat, she has been on amitriptyline with some benefit, she now is on 20 mg at night down from 50 mg.  The patient has been doing somewhat better with her sensory complaints but within the last several weeks has developed a viral type syndrome feeling general malaise, with this she began to have increased pain in the throat and tingling and itching sensations on the face on the right.  More recently she has had some neck stiffness and her vertigo has returned.  She has not had any numbness or weakness of the extremities.  She denies issues controlling the bowels or the bladder.  Her episodes of vertigo began in 2013.  The patient comes in the office today for further evaluation.  Within the last several days, her itching and tingling sensations on the right face have improved some.  Past Medical History:  Diagnosis Date  . Anxiety   . Ear pain    right some times in the left   . Gastritis   . IBS (irritable bowel syndrome)   . Vertigo 07/19/2019    Past Surgical History:  Procedure Laterality Date  . CESAREAN SECTION      Family History  Problem Relation Age of Onset  . Healthy Mother   . Healthy Father     Social history:  reports that she has never  smoked. She has never used smokeless tobacco. She reports that she does not drink alcohol and does not use drugs.   No Known Allergies  Medications:  Prior to Admission medications   Medication Sig Start Date End Date Taking? Authorizing Provider  amitriptyline (ELAVIL) 10 MG tablet Take 4 tablets (40 mg total) by mouth at bedtime. Patient taking differently: Take 20 mg by mouth at bedtime.  02/10/20  Yes Chelsea Ducking, MD    ROS:  Out of a complete 14 system review of symptoms, the patient complains only of the following symptoms, and all other reviewed systems are negative.  Throat pain Facial numbness and tingling Neck stiffness Vertigo  Blood pressure 118/82, pulse 95, height 5\' 3"  (1.6 m), weight 134 lb (60.8 kg), SpO2 97 %.  Physical Exam  General: The patient is alert and cooperative at the time of the examination.  Neuromuscular: Patient lacks about 20 degrees of full lateral rotation of the cervical spine bilaterally.  Skin: No significant peripheral edema is noted.   Neurologic Exam  Mental status: The patient is alert and oriented x 3 at the time of the examination. The patient has apparent normal recent and remote memory, with an apparently normal attention span and concentration ability.   Cranial nerves: Facial symmetry is present. Speech is normal, no aphasia or dysarthria is noted. Extraocular movements are full. Visual fields  are full.  Pupils are equal, round, and reactive to light.  Discs are flat bilaterally.  Evaluation of the throat reveals no inflammation or swelling.  Motor: The patient has good strength in all 4 extremities.  Sensory examination: Soft touch sensation is symmetric on the face, arms, and legs.  Pinprick sensation on the face is symmetric from 1 side to the next.  Coordination: The patient has good finger-nose-finger and heel-to-shin bilaterally.  Gait and station: The patient has a normal gait. Tandem gait is normal. Romberg is  negative. No drift is seen.  Reflexes: Deep tendon reflexes are symmetric.   Assessment/Plan:  1.  Chronic throat discomfort  2.  Neck stiffness  3.  Episodic vertigo  4.  Right facial paresthesias  The patient is having some new symptoms with facial paresthesias, we will check MRI of the brain with and without gadolinium enhancement.  If the symptoms worsen, we will add low-dose gabapentin to the amitriptyline.  She will have blood work today.  She will follow-up in about 6 months.  Jill Alexanders MD 05/18/2020 10:58 AM  [facility] [address] [place], [place] [address]  Phone [phone] Fax [phone]

## 2020-05-19 LAB — BASIC METABOLIC PANEL
BUN/Creatinine Ratio: 17 (ref 9–23)
BUN: 12 mg/dL (ref 6–24)
CO2: 24 mmol/L (ref 20–29)
Calcium: 9.7 mg/dL (ref 8.7–10.2)
Chloride: 103 mmol/L (ref 96–106)
Creatinine, Ser: 0.69 mg/dL (ref 0.57–1.00)
GFR calc Af Amer: 119 mL/min/{1.73_m2} (ref >59)
GFR calc non Af Amer: 103 mL/min/{1.73_m2} (ref >59)
Glucose: 89 mg/dL (ref 65–99)
Potassium: 4.4 mmol/L (ref 3.5–5.2)
Sodium: 139 mmol/L (ref 134–144)

## 2020-05-27 DIAGNOSIS — R2990 Unspecified symptoms and signs involving the nervous system: Secondary | ICD-10-CM

## 2020-05-27 DIAGNOSIS — U099 Post covid-19 condition, unspecified: Secondary | ICD-10-CM

## 2020-05-27 HISTORY — DX: Unspecified symptoms and signs involving the nervous system: R29.90

## 2020-05-27 HISTORY — DX: Post covid-19 condition, unspecified: U09.9

## 2020-05-31 ENCOUNTER — Telehealth: Payer: Self-pay | Admitting: Neurology

## 2020-05-31 NOTE — Telephone Encounter (Signed)
[employer] Auth: [employer] spoke to [facility] Ref # [id number] order sent to GI. They will reach out to the patient to schedule.

## 2020-06-02 IMAGING — CT CT ANGIOGRAPHY NECK
1 of 5 series · 2 of 16 positions shown · IV contrast (APPLIED)
Comparison: None.

CLINICAL DATA: Right pulsatile tinnitus. Right ear pain for 5
months. Nausea for 2-3 days. Vertigo.

EXAM:
CT ANGIOGRAPHY HEAD AND NECK
TECHNIQUE: Multidetector CT imaging of the head and neck was performed using
the standard protocol during bolus administration of intravenous
contrast. Multiplanar CT image reconstructions and MIPs were
obtained to evaluate the vascular anatomy. Carotid stenosis
measurements (when applicable) are obtained utilizing NASCET
criteria, using the distal internal carotid diameter as the
denominator.
CONTRAST:  75mL [id number] IOPAMIDOL ([id number]) INJECTION 76%

[Series 7: head/neck angio · axial · 0.36mm/px · z∈[+988,+1102]mm · 2 of 171 slices shown]
[im 57/171  soft-tissue]
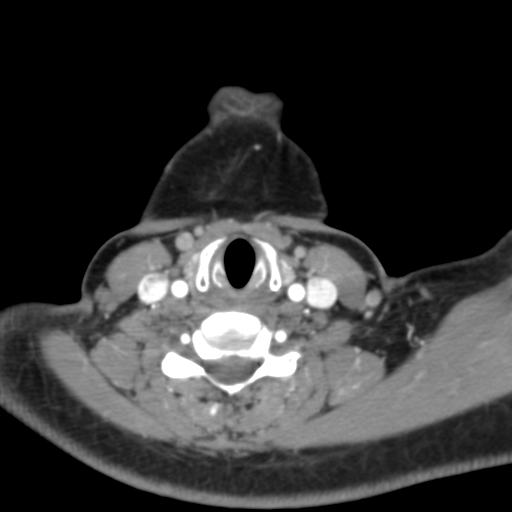
[im 114/171  bone]
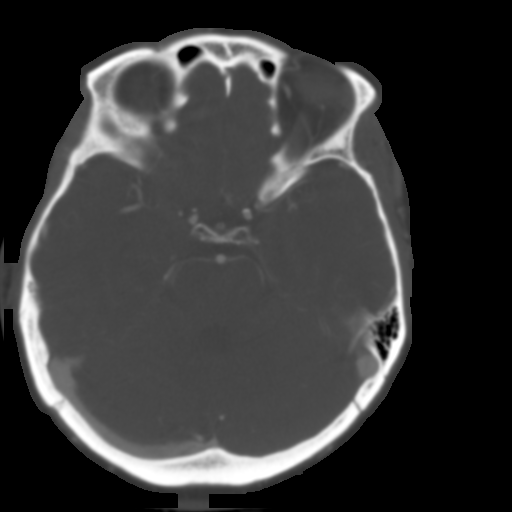

[2 of 16 positions shown; findings below may reference images not displayed]

FINDINGS: CT HEAD FINDINGS

Brain: There is no evidence of acute infarct, intracranial
hemorrhage, mass, midline shift, or extra-axial fluid collection.
The ventricles and sulci are normal.

Vascular: Mild calcific atherosclerosis in the carotid siphons. No
hyperdense vessel.

Skull: No fracture or focal osseous lesion.

Sinuses: Paranasal sinuses and mastoid air cells are clear.

Orbits: Unremarkable.

Review of the MIP images confirms the above findings

CTA NECK FINDINGS

Aortic arch: Standard 3 vessel aortic arch with widely patent arch
vessel origins.

Right carotid system: Patent and smooth without evidence of stenosis
or dissection.

Left carotid system: Patent and smooth without evidence of stenosis
or dissection.

Vertebral arteries: Patent with the left being mildly dominant. No
evidence of dissection or stenosis although right V1 assessment is
limited by venous contrast.

Skeleton: No acute osseous abnormality or suspicious osseous lesion.

Other neck: No mass or enlarged lymph nodes.

Upper chest: Clear lung apices.

Review of the MIP images confirms the above findings

CTA HEAD FINDINGS

Anterior circulation: The internal carotid arteries are patent from
skull base to carotid termini with minimal calcified plaque in the
supraclinoid segments bilaterally not resulting in significant
stenosis. ACAs and MCAs are patent without evidence of proximal
branch occlusion or significant stenosis. No aneurysm or vascular
malformation is identified.

Posterior circulation: Intracranial vertebral arteries are widely
patent to the basilar. Patent PICAs, AICAs, and SCAs are identified
bilaterally. PCAs are patent without evidence of significant
stenosis. There may be small posterior communicating arteries
bilaterally. No aneurysm or vascular malformation is identified.

Venous sinuses: Well opacified and patent without evidence of
thrombus.

Anatomic variants: None.

Delayed phase: No abnormal enhancement.

Review of the MIP images confirms the above findings
IMPRESSION: Negative head and neck CTA aside from minimal intracranial ICA
atherosclerosis. No evidence of dissection, vascular malformation,
or stenosis.

## 2020-07-08 ENCOUNTER — Other Ambulatory Visit: Payer: BC Managed Care – PPO

## 2020-07-18 ENCOUNTER — Other Ambulatory Visit: Payer: Self-pay | Admitting: Neurology

## 2020-07-18 MED ORDER — GABAPENTIN 100 MG PO CAPS: ORAL_CAPSULE | ORAL | 3 refills | Status: DC

## 2020-07-22 ENCOUNTER — Ambulatory Visit
Admission: RE | Admit: 2020-07-22 | Discharge: 2020-07-22 | Disposition: A | Discharge status: Home / Self Care | Payer: BC Managed Care – PPO | Source: Ambulatory Visit | Attending: Neurology | Admitting: Neurology

## 2020-07-22 ENCOUNTER — Telehealth: Payer: Self-pay | Admitting: Neurology

## 2020-07-22 DIAGNOSIS — R202 Paresthesia of skin: Secondary | ICD-10-CM

## 2020-07-22 DIAGNOSIS — R070 Pain in throat: Secondary | ICD-10-CM

## 2020-07-22 MED ORDER — GADOBENATE DIMEGLUMINE 529 MG/ML IV SOLN
12.0000 mL | Freq: Once | INTRAVENOUS | Status: AC | PRN
  Administered 2020-07-22: 12 mL via INTRAVENOUS

## 2020-07-22 NOTE — Telephone Encounter (Signed)
MRI of the brain is normal.   MRI brain 07/22/20:  IMPRESSION: Unremarkable MRI scan of the brain with and without contrast.

## 2020-07-23 ENCOUNTER — Telehealth: Payer: Self-pay | Admitting: Neurology

## 2020-07-23 NOTE — Telephone Encounter (Signed)
Pt called wanting to discuss her MRI results and also discuss some things about her Covid situation. Please advise.

## 2020-07-23 NOTE — Telephone Encounter (Signed)
Pt has called to report that a PA is needed on her gabapentin.

## 2020-07-23 NOTE — Telephone Encounter (Signed)
Pt spoke to MD, see other phone encounter

## 2020-07-23 NOTE — Telephone Encounter (Signed)
I called the patient.  The patient is having some right-sided mouth and throat pain, she is back on amitriptyline taking 40 mg at night, she has not yet started the gabapentin as her insurance apparently would not cover it because she had picked up a prescription previously which she lost.  She also takes clonazepam 0.5 mg, she takes 1/2 tablet at night.  She is to get on the gabapentin, she can use this with amitriptyline.  The MRI of the brain was completely normal.  The patient has seen an oral surgeon, there is no visible lesion on the tongue apparently.

## 2020-07-24 ENCOUNTER — Telehealth: Payer: Self-pay | Admitting: Emergency Medicine

## 2020-07-24 ENCOUNTER — Ambulatory Visit: Payer: BC Managed Care – PPO | Admitting: Neurology

## 2020-07-24 NOTE — Telephone Encounter (Signed)
[place] [place] for Gabapentin PA.  Was approved over the phone, case ID [id number], approval dates 06/24/20-07/24/21.  Waiting for fax confirmation, patient will also receive letter in the mail.

## 2020-07-26 DIAGNOSIS — K146 Glossodynia: Secondary | ICD-10-CM | POA: Insufficient documentation

## 2020-11-15 ENCOUNTER — Encounter: Payer: Self-pay | Admitting: Neurology

## 2020-11-15 ENCOUNTER — Telehealth (INDEPENDENT_AMBULATORY_CARE_PROVIDER_SITE_OTHER): Payer: BC Managed Care – PPO | Admitting: Neurology

## 2020-11-15 DIAGNOSIS — R070 Pain in throat: Secondary | ICD-10-CM

## 2020-11-15 MED ORDER — GABAPENTIN 100 MG PO CAPS: 200.0000 mg | ORAL_CAPSULE | Freq: Three times a day (TID) | ORAL | 1 refills | Status: DC

## 2020-11-15 NOTE — Progress Notes (Signed)
° ° °  Virtual Visit via Video Note  I connected with Chelsea Bradshaw on 11/16/20 at  4:00 PM EST by a video enabled telemedicine application and verified that I am speaking with the correct person using two identifiers.  Location: Patient: The patient is at home. Provider: The MD is in office.   I discussed the limitations of evaluation and management by telemedicine and the availability of in person appointments. The patient expressed understanding and agreed to proceed.  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  Since she had COVID in August 2021, she has had more tongue discomfort.  She has tapered off of the amitriptyline completely and is just on 100 mg of gabapentin 3 times daily.  She will have good days and bad days with the discomfort.  The discomfort is not associated with chewing, talking, or swallowing.  Often times, the pain is starting to become more significant in the morning, this is new for her.  She denies any other issues such as balance changes, double vision, or weakness.  She is tolerating the low-dose gabapentin fairly well.  She reports that when she had oral clonidine solution given to her by her ENT doctor, this seemed to help as well.   Observations/Objective: The patient is alert and cooperative.  Speech is normal.  Extract movements are full.  The patient is able to protrude tongue to midline with good lateral movement of the tongue without pain.  Facial symmetry is present.  She has good finger-nose-finger bilaterally.  Assessment and Plan: 1.  Neuropathic right ear and tongue pain  The patient will be increased on the gabapentin taking 200 mg 3 times daily.  If she is not getting relief with this within 1 to 2 weeks, she will call me and we will continue to increase the dose.  She will follow-up here in about 4 months.  Follow  Up Instructions: Follow-up in 4 months.   I discussed the assessment and treatment plan with the patient. The patient was provided an opportunity to ask questions and all were answered. The patient agreed with the plan and demonstrated an understanding of the instructions.   The patient was advised to call back or seek an in-person evaluation if the symptoms worsen or if the condition fails to improve as anticipated.  I provided 20 minutes of non-face-to-face time during this encounter.   Kathrynn Ducking, MD

## 2020-12-04 ENCOUNTER — Other Ambulatory Visit: Payer: Self-pay

## 2020-12-10 ENCOUNTER — Ambulatory Visit: Payer: BC Managed Care – PPO | Admitting: Nurse Practitioner

## 2020-12-18 ENCOUNTER — Ambulatory Visit (INDEPENDENT_AMBULATORY_CARE_PROVIDER_SITE_OTHER): Payer: BC Managed Care – PPO | Admitting: Nurse Practitioner

## 2020-12-18 VITALS — BP 105/64 | HR 82 | Temp 97.5°F

## 2020-12-18 DIAGNOSIS — K146 Glossodynia: Secondary | ICD-10-CM

## 2020-12-18 DIAGNOSIS — R070 Pain in throat: Secondary | ICD-10-CM | POA: Diagnosis not present

## 2020-12-18 MED ORDER — MAGIC MOUTHWASH: 5.0000 mL | Freq: Four times a day (QID) | ORAL | 0 refills | Status: AC | PRN

## 2020-12-18 NOTE — Assessment & Plan Note (Signed)
Tongue pain:   Stay well hydrated  Stay active  Deep breathing exercises  May start vitamin C daily, vitamin D3 daily, Zinc daily  May take tylenol or fever or pain  Will order magic mouthwash  May try biotene rinse or spray  Please keep follow up with oral facial surgeon     Follow up:  Follow up if needed

## 2020-12-18 NOTE — Progress Notes (Signed)
@Patient  ID: Chelsea Bradshaw, female    DOB: 11/27/1970, 50 y.o.   MRN: [id number]  Chief Complaint  Patient presents with  . tongue pain    Tested + for covid in August 2021, continues to have right side tongue pain    Referring provider: Mancel Bale, PA-C  HPI   Patient presents today for post COVID care clinic visit.  Patient was diagnosed with Covid in August 2021.  She continues to have right-sided tongue pain.  She has a past history before having COVID of right-sided ear pain.  She has been to multiple specialist for these 2 issues.  She has had recent full lab work-up done including vitamin B12, vitamin D, hormone levels.  Patient states that she was treated with Klonopin mouthwash by the oral facial specialist and this did seem to provide some relief.  She is scheduled to go back to see them within the next few weeks. Denies f/c/s, n/v/d, hemoptysis, PND,  chest pain or edema.        No Known Allergies  Immunization History  Administered Date(s) Administered  . Moderna Sars-Covid-2 Vaccination 01/26/2020, 02/23/2020  . Tdap 07/07/2019    Past Medical History:  Diagnosis Date  . Anxiety   . Ear pain    right some times in the left   . Gastritis   . IBS (irritable bowel syndrome)   . Vertigo 07/19/2019    Tobacco History: Social History   Tobacco Use  Smoking Status Never Smoker  Smokeless Tobacco Never Used   Counseling given: Not Answered   Outpatient Encounter Medications as of 12/18/2020  Medication Sig  . magic mouthwash SOLN Take 5 mLs by mouth 4 (four) times daily as needed for up to 7 days for mouth pain.  Marland Kitchen amitriptyline (ELAVIL) 10 MG tablet Take by mouth.  . clonazePAM (KLONOPIN) 0.5 MG tablet Take 0.25 mg by mouth at bedtime.  . gabapentin (NEURONTIN) 100 MG capsule Take 2 capsules (200 mg total) by mouth 3 (three) times daily. 1 capsule 3 times daily for 1 week, then take 2 capsules 3 times daily   No facility-administered encounter  medications on file as of 12/18/2020.     Review of Systems  Review of Systems  Constitutional: Negative.  Negative for fatigue and fever.  HENT: Negative.        Right side tongue pain  Respiratory: Positive for cough and shortness of breath.   Cardiovascular: Negative.   Gastrointestinal: Negative.   Allergic/Immunologic: Negative.   Neurological: Negative.   Psychiatric/Behavioral: Negative.        Physical Exam  There were no vitals taken for this visit.  Wt Readings from Last 5 Encounters:  05/18/20 134 lb (60.8 kg)  01/17/20 138 lb (62.6 kg)  07/19/19 140 lb (63.5 kg)  03/25/19 110 lb (49.9 kg)  03/24/19 110 lb (49.9 kg)     Physical Exam Vitals and nursing note reviewed.  Constitutional:      General: She is not in acute distress.    Appearance: She is well-developed and well-nourished.  HENT:     Mouth/Throat:     Mouth: Mucous membranes are moist. No injury, lacerations, oral lesions or angioedema.     Tongue: No lesions. Tongue does not deviate from midline.  Cardiovascular:     Rate and Rhythm: Normal rate and regular rhythm.  Pulmonary:     Effort: Pulmonary effort is normal.     Breath sounds: Normal breath sounds.  Musculoskeletal:  Right lower leg: Edema present.     Left lower leg: No edema.  Neurological:     Mental Status: She is alert and oriented to person, place, and time.  Psychiatric:        Mood and Affect: Mood and affect and mood normal.        Behavior: Behavior normal.        Assessment & Plan:   Throat pain Tongue pain:   Stay well hydrated  Stay active  Deep breathing exercises  May start vitamin C daily, vitamin D3 daily, Zinc daily  May take tylenol or fever or pain  Will order magic mouthwash  May try biotene rinse or spray  Please keep follow up with oral facial surgeon     Follow up:  Follow up if needed      Fenton Foy, NP 12/18/2020

## 2020-12-18 NOTE — Patient Instructions (Addendum)
History of Covid Tongue pain:   Stay well hydrated  Stay active  Deep breathing exercises  May start vitamin C daily, vitamin D3 daily, Zinc daily  May take tylenol or fever or pain  Will order magic mouthwash  May try biotene rinse or spray  Please keep follow up with oral facial surgeon     Follow up:  Follow up if needed

## 2021-01-25 ENCOUNTER — Ambulatory Visit: Payer: BC Managed Care – PPO | Admitting: Gastroenterology

## 2021-02-06 ENCOUNTER — Other Ambulatory Visit: Payer: Self-pay | Admitting: Neurology

## 2021-02-06 MED ORDER — GABAPENTIN 100 MG PO CAPS: 200.0000 mg | ORAL_CAPSULE | Freq: Three times a day (TID) | ORAL | 1 refills | Status: DC

## 2021-04-04 ENCOUNTER — Other Ambulatory Visit: Payer: Self-pay | Admitting: Neurology

## 2021-04-04 MED ORDER — GABAPENTIN 300 MG PO CAPS
300.0000 mg | ORAL_CAPSULE | Freq: Three times a day (TID) | ORAL | 3 refills | Status: DC
Start: 2021-04-04 — End: 2021-04-17

## 2021-04-17 ENCOUNTER — Other Ambulatory Visit: Payer: Self-pay | Admitting: Emergency Medicine

## 2021-04-17 MED ORDER — GABAPENTIN 300 MG PO CAPS: 300.0000 mg | ORAL_CAPSULE | Freq: Three times a day (TID) | ORAL | 1 refills | Status: DC

## 2021-06-10 ENCOUNTER — Ambulatory Visit: Payer: BC Managed Care – PPO | Admitting: Neurology

## 2021-06-17 ENCOUNTER — Ambulatory Visit: Payer: BC Managed Care – PPO | Admitting: Neurology

## 2021-06-24 NOTE — Progress Notes (Signed)
Chief Complaint  Patient presents with   Follow-up    Rm 1, alone. Past 2 days pt has had increase in PN on R side of tongue, ear, and throat, burning all day long. Pt states she had throbbing pn in R eye, saw her eye doctor and everything checked out ok.       HISTORY OF PRESENT ILLNESS: 06/25/21 ALL:  Chelsea Bradshaw is a 50 y.o. female here today for follow up for neuropathic right ear and tongue pain. At last visit with Dr Jannifer Franklin in 10/2020, gabapentin dose was increased from '100mg'$  TID to '200mg'$  TID. She called in June to report continued pain and dose was increased to '300mg'$  TID. She is currently taking '100mg'$  twice daily and 300 at bedtime. She did not realize that she was supposed to take '300mg'$  three times daily. She is tolerating dose well. She also takes clonazepam at night that can make her a little groggy but she feels she is doing ok. Pain has been much worse over the past two days. While in [place] for three weeks pain was tolerable. Chewing sugarless gum makes pain better.   She endorses more stress, recently. Her daughter recently started college out of state. She is tearful in the office. She also reports starting a homeopathic medication from a provider in [place]. She knows it has sulfur in it. She started regimen yesterday.    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  Since she had COVID in August 2021, she has had more tongue discomfort.  She has tapered off of the amitriptyline completely and is just on 100 mg of gabapentin 3 times daily.  She will have good days and bad days with the discomfort.  The discomfort is not associated with chewing, talking, or swallowing.  Often times, the pain is starting to become more significant in the morning, this is new for her.  She denies any other issues such as balance changes, double  vision, or weakness.  She is tolerating the low-dose gabapentin fairly well.  She reports that when she had oral clonidine solution given to her by her ENT doctor, this seemed to help as well.   REVIEW OF SYSTEMS: Out of a complete 14 system review of symptoms, the patient complains only of the following symptoms, neuropathic pain and all other reviewed systems are negative.   ALLERGIES: No Known Allergies   HOME MEDICATIONS: Outpatient Medications Prior to Visit  Medication Sig Dispense Refill   clonazePAM (KLONOPIN) 0.5 MG tablet Take 0.25 mg by mouth at bedtime.     gabapentin (NEURONTIN) 300 MG capsule Take 1 capsule (300 mg total) by mouth 3 (three) times daily. 270 capsule 1   gabapentin (NEURONTIN) 100 MG capsule Take 100 mg by mouth as directed. 1 tablet in the AM, 1 tablet at noon, and takes '300mg'$  at night     No facility-administered medications prior to visit.     PAST MEDICAL HISTORY: Past Medical History:  Diagnosis Date   Anxiety    Ear pain    right some times in the left    Gastritis    IBS (irritable bowel syndrome)    Vertigo 07/19/2019     PAST SURGICAL HISTORY: Past Surgical History:  Procedure Laterality Date   CESAREAN SECTION       FAMILY HISTORY: Family  History  Problem Relation Age of Onset   Healthy Mother    Healthy Father      SOCIAL HISTORY: Social History   Socioeconomic History   Marital status: Married    Spouse name: Not on file   Number of children: 1   Years of education: Not on file   Highest education level: Not on file  Occupational History   Not on file  Tobacco Use   Smoking status: Never   Smokeless tobacco: Never  Vaping Use   Vaping Use: Never used  Substance and Sexual Activity   Alcohol use: Never   Drug use: Never   Sexual activity: Not Currently  Other Topics Concern   Not on file  Social History Narrative   Right handed    Caffeine 2 cups per day.    Social Determinants of Health   Financial  Resource Strain: Not on file  Food Insecurity: Not on file  Transportation Needs: Not on file  Physical Activity: Not on file  Stress: Not on file  Social Connections: Not on file  Intimate Partner Violence: Not on file     PHYSICAL EXAM  Vitals:   06/25/21 1102  BP: 103/72  Pulse: 85  Weight: 126 lb (57.2 kg)  Height: '5\' 3"'$  (1.6 m)   Body mass index is 22.32 kg/m.   Generalized: Well developed, in no acute distress  Cardiology: normal rate and rhythm, no murmur auscultated  Respiratory: clear to auscultation bilaterally    Neurological examination  Mentation: Alert oriented to time, place, history taking. Follows all commands speech and language fluent Cranial nerve II-XII: Pupils were equal round reactive to light. Extraocular movements were full, visual field were full on confrontational test. Facial sensation and strength were normal. Uvula tongue midline. Head turning and shoulder shrug  were normal and symmetric. Motor: The motor testing reveals 5 over 5 strength of all 4 extremities. Good symmetric motor tone is noted throughout.  Sensory: Sensory testing is intact to soft touch on all 4 extremities. No evidence of extinction is noted.  Gait and station: Gait is normal. ENT: no erythema or edema noted of posterior pharynx or tongue. Mucosa moist and without obvious abnormalities.    DIAGNOSTIC DATA (LABS, IMAGING, TESTING) - I reviewed patient records, labs, notes, testing and imaging myself where available.  No results found for: WBC, HGB, HCT, MCV, PLT    Component Value Date/Time   NA 139 05/18/2020 1130   K 4.4 05/18/2020 1130   CL 103 05/18/2020 1130   CO2 24 05/18/2020 1130   GLUCOSE 89 05/18/2020 1130   BUN 12 05/18/2020 1130   CREATININE 0.69 05/18/2020 1130   CALCIUM 9.7 05/18/2020 1130   GFRNONAA 103 05/18/2020 1130   GFRAA 119 05/18/2020 1130   No results found for: CHOL, HDL, LDLCALC, LDLDIRECT, TRIG, CHOLHDL No results found for: HGBA1C No  results found for: VITAMINB12 No results found for: TSH  No flowsheet data found.   No flowsheet data found.   ASSESSMENT AND PLAN  51 y.o. year old female  has a past medical history of Anxiety, Ear pain, Gastritis, IBS (irritable bowel syndrome), and Vertigo (07/19/2019). here with    Throat pain  Paresthesia  Promiss continues to have waxing and waning neuropathic pain of right eye, ear, throat and tongue. Workup has been unremarkable. Neuro exam intact. She had not yet increased gabapentin dose as directed with previous messages. I will have her increase gabapentin to '300mg'$  three times daily. It  is unclear what substances are in the homeopathic blend she is taking from [place]. She will monitor closely for any unusual symptoms or side effects. She was encouraged to continue discussion with PCP regarding any concerns of anxiety. She is uncertain if she noted any improvement of pain while on amitriptyline, however, review of notes does show she seemed to be well controlled on amitriptyline '10mg'$  and gabapentin '100mg'$  TID for a period of time. May consider resuming amitriptyline. Healthy lifestyle habits encouraged. She will follow up in 6 months, sooner if needed.    No orders of the defined types were placed in this encounter.    No orders of the defined types were placed in this encounter.     Debbora Presto, MSN, FNP-C 06/25/2021, 12:24 PM  [facility] [address], [place], [place] [address] [phone]

## 2021-06-24 NOTE — Patient Instructions (Signed)
Below is our plan:  We will increase gabapentin dose to '300mg'$  three times daily. Please allow 4-6 weeks to assess if this is helpful If not, we can consider adding amitriptyline if you feel this was helpful.   Please make sure you are staying well hydrated. I recommend 50-60 ounces daily. Well balanced diet and regular exercise encouraged. Consistent sleep schedule with 6-8 hours recommended.   Please continue follow up with care team as directed.   Follow up with Chelsea Bradshaw in 6 months   You may receive a survey regarding today's visit. I encourage you to leave honest feed back as I do use this information to improve patient care. Thank you for seeing me today!

## 2021-06-25 ENCOUNTER — Encounter: Payer: Self-pay | Admitting: Family Medicine

## 2021-06-25 ENCOUNTER — Ambulatory Visit (INDEPENDENT_AMBULATORY_CARE_PROVIDER_SITE_OTHER): Payer: BC Managed Care – PPO | Admitting: Family Medicine

## 2021-06-25 VITALS — BP 103/72 | HR 85 | Ht 63.0 in | Wt 126.0 lb

## 2021-06-25 DIAGNOSIS — R070 Pain in throat: Secondary | ICD-10-CM | POA: Diagnosis not present

## 2021-06-25 DIAGNOSIS — R202 Paresthesia of skin: Secondary | ICD-10-CM | POA: Diagnosis not present

## 2021-06-25 NOTE — Progress Notes (Signed)
I have read the note, and I agree with the clinical assessment and plan.  Dustyn Dansereau K Hutson Luft   

## 2021-06-26 ENCOUNTER — Encounter: Payer: Self-pay | Admitting: Family Medicine

## 2021-07-18 ENCOUNTER — Telehealth: Payer: Self-pay | Admitting: Family Medicine

## 2021-07-18 NOTE — Telephone Encounter (Signed)
Called the patient and since the increase to the medication was just made on the 19th, informed her that the full effects of the medication can take 2-4 wks. Informed her she should give the medication a little more time. She also wanted to make sure that Dr. Jannifer Franklin knew she had a CT of her jaw ([facility] CT) on 9/17 (in care everywhere) and would like to make sure Dr Jannifer Franklin also takes a look at it. Advised I would pass that information along for him to review when he returns. Pt verbalized understanding. Pt had no questions at this time but was encouraged to call back if questions arise.

## 2021-07-18 NOTE — Telephone Encounter (Signed)
I called patient and left a message.  The CT of the jaw showed only minimal arthritic changes in the temporomandibular joints, otherwise unremarkable.  The maximum dose of the gabapentin is about 3600 mg daily, some individuals can tolerate high doses and some cannot.  She we will continue on the gabapentin and we will increase the dose as long she is tolerating the drug.

## 2021-07-18 NOTE — Telephone Encounter (Signed)
Pt called stating that she would like to discuss the dosage increasing of her gabapentin (NEURONTIN) 300 MG capsule and when will the effects start to show. Please advise.

## 2021-07-21 ENCOUNTER — Encounter: Payer: Self-pay | Admitting: Family Medicine

## 2021-07-22 ENCOUNTER — Encounter: Payer: Self-pay | Admitting: Family Medicine

## 2021-07-31 ENCOUNTER — Ambulatory Visit: Payer: BC Managed Care – PPO | Admitting: Obstetrics and Gynecology

## 2021-08-06 ENCOUNTER — Other Ambulatory Visit (HOSPITAL_COMMUNITY)
Admission: RE | Admit: 2021-08-06 | Discharge: 2021-08-06 | Disposition: A | Discharge status: Home / Self Care | Payer: BC Managed Care – PPO | Source: Ambulatory Visit | Attending: Obstetrics and Gynecology | Admitting: Obstetrics and Gynecology

## 2021-08-06 ENCOUNTER — Ambulatory Visit (INDEPENDENT_AMBULATORY_CARE_PROVIDER_SITE_OTHER): Payer: BC Managed Care – PPO | Admitting: Obstetrics and Gynecology

## 2021-08-06 ENCOUNTER — Other Ambulatory Visit: Payer: Self-pay

## 2021-08-06 ENCOUNTER — Encounter: Payer: Self-pay | Admitting: Obstetrics and Gynecology

## 2021-08-06 VITALS — BP 110/62 | HR 74 | Ht 63.0 in | Wt 125.0 lb

## 2021-08-06 DIAGNOSIS — N914 Secondary oligomenorrhea: Secondary | ICD-10-CM

## 2021-08-06 DIAGNOSIS — Z124 Encounter for screening for malignant neoplasm of cervix: Secondary | ICD-10-CM | POA: Diagnosis not present

## 2021-08-06 DIAGNOSIS — N951 Menopausal and female climacteric states: Secondary | ICD-10-CM | POA: Diagnosis not present

## 2021-08-06 MED ORDER — MEDROXYPROGESTERONE ACETATE 5 MG PO TABS: ORAL_TABLET | ORAL | 3 refills | Status: DC

## 2021-08-06 NOTE — Patient Instructions (Signed)
Perimenopause ?Perimenopause is the normal time of a woman's life when the levels of estrogen, the female hormone produced by the ovaries, begin to decrease. This leads to changes in menstrual periods before they stop completely (menopause). Perimenopause can begin 2-8 years before menopause. During perimenopause, the ovaries may or may not produce an egg and a woman can still become pregnant. ?What are the causes? ?This condition is caused by a natural change in hormone levels that happens as you get older. ?What increases the risk? ?This condition is more likely to start at an earlier age if you have certain medical conditions or have undergone treatments, including: ?A tumor of the pituitary gland in the brain. ?A disease that affects the ovaries and hormone production. ?Certain cancer treatments, such as chemotherapy or hormone therapy, or radiation therapy on the pelvis. ?Heavy smoking and excessive alcohol use. ?Family history of early menopause. ?What are the signs or symptoms? ?Perimenopausal changes affect each woman differently. Symptoms of this condition may include: ?Hot flashes. ?Irregular menstrual periods. ?Night sweats. ?Changes in feelings about sex. This could be a decrease in sex drive or an increased discomfort around your sexuality. ?Vaginal dryness. ?Headaches. ?Mood swings. ?Depression. ?Problems sleeping (insomnia). ?Memory problems or trouble concentrating. ?Irritability. ?Tiredness. ?Weight gain. ?Anxiety. ?Trouble getting pregnant. ?How is this diagnosed? ?This condition is diagnosed based on your medical history, a physical exam, your age, your menstrual history, and your symptoms. Hormone tests may also be done. ?How is this treated? ?In some cases, no treatment is needed. You and your health care provider should make a decision together about whether treatment is necessary. Treatment will be based on your individual condition and preferences. Various treatments are available, such  as: ?Menopausal hormone therapy (MHT). ?Medicines to treat specific symptoms. ?Acupuncture. ?Vitamin or herbal supplements. ?Before starting treatment, make sure to let your health care provider know if you have a personal or family history of: ?Heart disease. ?Breast cancer. ?Blood clots. ?Diabetes. ?Osteoporosis. ?Follow these instructions at home: ?Medicines ?Take over-the-counter and prescription medicines only as told by your health care provider. ?Take vitamin supplements only as told by your health care provider. ?Talk with your health care provider before starting any herbal supplements. ?Lifestyle ? ?Do not use any products that contain nicotine or tobacco, such as cigarettes, e-cigarettes, and chewing tobacco. If you need help quitting, ask your health care provider. ?Get at least 30 minutes of physical activity on 5 or more days each week. ?Eat a balanced diet that includes fresh fruits and vegetables, whole grains, soybeans, eggs, lean meat, and low-fat dairy. ?Avoid alcoholic and caffeinated beverages, as well as spicy foods. This may help prevent hot flashes. ?Get 7-8 hours of sleep each night. ?Dress in layers that can be removed to help you manage hot flashes. ?Find ways to manage stress, such as deep breathing, meditation, or journaling. ?General instructions ? ?Keep track of your menstrual periods, including: ?When they occur. ?How heavy they are and how long they last. ?How much time passes between periods. ?Keep track of your symptoms, noting when they start, how often you have them, and how long they last. ?Use vaginal lubricants or moisturizers to help with vaginal dryness and improve comfort during sex. ?You can still become pregnant if you are having irregular periods. Make sure you use contraception during perimenopause if you do not want to get pregnant. ?Keep all follow-up visits. This is important. This includes any group therapy or counseling. ?Contact a health care provider if: ?You  have   heavy vaginal bleeding or pass blood clots. ?Your period lasts more than 2 days longer than normal. ?Your periods are recurring sooner than 21 days. ?You bleed after having sex. ?You have pain during sex. ?Get help right away if you have: ?Chest pain, trouble breathing, or trouble talking. ?Severe depression. ?Pain when you urinate. ?Severe headaches. ?Vision problems. ?Summary ?Perimenopause is the time when a woman's body begins to move into menopause. This may happen naturally or as a result of other health problems or medical treatments. ?Perimenopause can begin 2-8 years before menopause, and it can last for several years. ?Perimenopausal symptoms can be managed through medicines, lifestyle changes, and complementary therapies such as acupuncture. ?This information is not intended to replace advice given to you by your health care provider. Make sure you discuss any questions you have with your health care provider. ?Document Revised: 03/29/2020 Document Reviewed: 03/29/2020 ?Elsevier Patient Education ? 2022 Elsevier Inc. ? ?

## 2021-08-06 NOTE — Progress Notes (Signed)
50 y.o. No obstetric history on file. Married Unavailable Unavailable female here for a consultation from [facility], [place] for PMP bleeding.   07/04/21 labs [facility] 106, estradiol <5, TSH 2.86, HgbA1C 6.1%  She says that she got COVID last year. She says that after she has bleeding about every other month.  Prior to 8/21 she was having monthly cycles. After she got covid in 8/21 she didn't have a cycle for 4 months. Since then she thinks she had a cycle in March, she thinks she had a cycle a few months after that. Definitely had a cycle in 8/22 and 9/22. She has been bleeding for ~5 days. She saturates a pad in 3-4 hours. No spotting in between her cycles. Mild back pain with her cycles.  No hot flashes, feels hot at night but no sweets. She is on klonopin and gabapentin which helps her sleep. No vaginal dryness.   Not sexually active.   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 She is on Gabapentin for this.     Patient's last menstrual period was 07/12/2021.          Sexually active: No.  The current method of family planning is post menopausal status.    Exercising: Yes.     Walking  Smoker:  no  Health Maintenance: Pap:  2018 [place] History of abnormal Pap:  no MMG:  03/30/2020 Bi-rads 1 NEG, she is setting up an appointment.  BMD:   none  Colonoscopy: none, she is going to do the cologuard  TDaP:  07/07/2019 Gardasil: na   reports that she has never smoked. She has never used smokeless tobacco. She reports that she does not drink alcohol and does not use drugs. At home currently. Daughter is 24, getting her masters in education in [place]. Husband is a [place], they have traveled the world.   Past Medical History:  Diagnosis Date   Anxiety    Ear pain    right some times in the left    Gastritis    IBS (irritable bowel syndrome)    Vertigo 07/19/2019    Past Surgical History:  Procedure Laterality Date   CESAREAN SECTION       Current Outpatient Medications  Medication Sig Dispense Refill   clonazePAM (KLONOPIN) 0.5 MG tablet Take 0.25 mg by mouth at bedtime.     famotidine (PEPCID) 20 MG tablet TAKE ONE TABLET BY MOUTH 2 TIMES DAILY.     gabapentin (NEURONTIN) 300 MG capsule Take 1 capsule (300 mg total) by mouth 3 (three) times daily. 270 capsule 1   No current facility-administered medications for this visit.    Family History  Problem Relation Age of Onset   Healthy Mother    Healthy Father     Review of Systems  Genitourinary:  Positive for vaginal bleeding.   Exam:   BP 110/62   Pulse 74   Ht 5\' 3"  (1.6 m)   Wt 125 lb (56.7 kg)   LMP 07/12/2021   SpO2 99%   BMI 22.14 kg/m   Weight change: @WEIGHTCHANGE @ Height:   Height: 5\' 3"  (160 cm)  Ht Readings from Last 3 Encounters:  08/06/21 5\' 3"  (1.6 m)  06/25/21 5\' 3"  (1.6 m)  05/18/20 5\' 3"  (1.6 m)    General appearance: alert, cooperative and appears stated age Head: Normocephalic, without obvious abnormality, atraumatic Neck: no adenopathy, supple, symmetrical, trachea midline and thyroid normal to inspection  and palpation Lungs: clear to auscultation bilaterally Cardiovascular: regular rate and rhythm Abdomen: soft, non-tender; non distended,  no masses,  no organomegaly Extremities: extremities normal, atraumatic, no cyanosis or edema Skin: Skin color, texture, turgor normal. No rashes or lesions Lymph nodes: Cervical, supraclavicular, and axillary nodes normal. No abnormal inguinal nodes palpated Neurologic: Grossly normal   Pelvic: External genitalia:  no lesions              Urethra:  normal appearing urethra with no masses, tenderness or lesions              Bartholins and Skenes: normal                 Vagina: normal appearing vagina with normal color and discharge, no lesions              Cervix: no cervical motion tenderness and no lesions               Bimanual Exam:  Uterus:   retroverted, not appreciably enlarged,  minimal mobility, mildly tender              Adnexa:  no masses, mild diffuse tenderness               Rectovaginal: Confirms               Anus:  normal sphincter tone, no lesions  She was tender from the time the speculum was inserted until the end of the exam. States she is always tender with pelvic exams  Gae Dry, chaperoned for the exam.  1. Secondary oligomenorrhea Normal TSH, elevated FSH, c/w perimenopause. Discussed use of cyclic progesterone to try and help prevent anovulatory bleeding - medroxyPROGESTERone (PROVERA) 5 MG tablet; Take one tablet a day for 5 days every other month if no spontaneous cycles.  Dispense: 15 tablet; Refill: 3 -Calendar cycles and use of provera. Discussed parameters that she should call with.  2. Perimenopausal Discussed perimenopause. No significant vasomotor symptoms - medroxyPROGESTERone (PROVERA) 5 MG tablet; Take one tablet a day for 5 days every other month if no spontaneous cycles.  Dispense: 15 tablet; Refill: 3  3. Screening for cervical cancer - Cytology - PAP  CC: Windell Hummingbird, [place] Letter sent

## 2021-08-08 LAB — CYTOLOGY - PAP
Comment: NEGATIVE
Diagnosis: NEGATIVE
High risk HPV: NEGATIVE

## 2021-08-14 NOTE — Telephone Encounter (Signed)
Any suggestions>?

## 2021-08-28 ENCOUNTER — Encounter: Payer: Self-pay | Admitting: Gastroenterology

## 2021-08-28 ENCOUNTER — Ambulatory Visit (INDEPENDENT_AMBULATORY_CARE_PROVIDER_SITE_OTHER): Payer: BC Managed Care – PPO | Admitting: Gastroenterology

## 2021-08-28 VITALS — BP 90/60 | HR 84 | Ht 62.25 in | Wt 127.1 lb

## 2021-08-28 DIAGNOSIS — R112 Nausea with vomiting, unspecified: Secondary | ICD-10-CM

## 2021-08-28 DIAGNOSIS — D508 Other iron deficiency anemias: Secondary | ICD-10-CM | POA: Diagnosis not present

## 2021-08-28 DIAGNOSIS — R1013 Epigastric pain: Secondary | ICD-10-CM | POA: Diagnosis not present

## 2021-08-28 MED ORDER — FERROUS SULFATE 324 (65 FE) MG PO TBEC: DELAYED_RELEASE_TABLET | ORAL | Status: DC

## 2021-08-28 MED ORDER — SUTAB 1479-225-188 MG PO TABS: 1.0000 | ORAL_TABLET | Freq: Once | ORAL | 0 refills | Status: AC

## 2021-08-28 NOTE — Progress Notes (Signed)
HPI :  50 y/o female here for a follow up visit for multiple upper tract symptoms. She was last seen here in 02/2019. Please see her intake visit from 02/24/19. She has had issues with nausea / abdominal bloating, decreased appetite, in the setting of severe R ear pain and severe anxiety, for which she has had an extensive evaluation in multiple locations over recent years. She has had episodes of these symptoms in the past when living in [place] and [place], workup as outlined below.   She has been on a variety of medicines for anxiety in the past including Elavil 25 mg nightly.  She has been on Protonix as well as omeprazole in the past as well as Carafate.  She does have a history of H. pylori that has been treated previously and eradicated.  The last time I saw her she had been doing well on PPI, we tried to titrate down the dose and she had done well without any medications actually for over a year or 2.  She states she traveled to [place] this past August and felt fine during that trip and was eating what she wanted without problems, and until she returned to the [place] following that vacation and symptoms have recurred in recent months.  She endorses headsets stomach with occasional nausea.  She has upper abdominal bloating and heaviness to her stomach.  Can come and go, she states during the day she is usually feeling okay but at nighttime it tends to bother her more.  She denies any overt heartburn or reflux at this time.  No dysphagia.  We had resumed omeprazole 20 mg a day after she states Pepcid did not help for the symptoms initially.  She states the omeprazole has generally made her feel better and she is doing better on this regimen that she has before but still has some mild symptoms.  No nausea or vomiting.  She is not seeing any blood in her stool.  She denies any NSAID use.  She does have persistent burning of her tongue after having COVID last year and is on gabapentin for that, she is not  sure if it helps.  She did have a few weeks of loose stools around time of symptoms recurred but then they resolved.  She states that she does have some ongoing stress in her life and feels anxious at times.  Her daughter recently went back to school at [employer] to get her masters degree.  She is no longer taking any medication for anxiety.  We reviewed her labs that are in care everywhere (not all of her labs are actually in epic).  In September she had mild anemia with a hemoglobin of 12.1, MCV 87.  Iron studies showed a ferritin of only 11, while her iron sat was normal, iron level was 91, TIBC 329.  She also had a normal B12 and folate level.  LFTs normal.  ESR 7.  TSH normal.  She had another H. pylori breath test in September which was negative.  She has had some irregular menses in recent months, some of which have been heavy.  We discussed if she wanted to pursue endoscopic evaluation in light of her iron see recurrence of symptoms.    H  Breath test negative 02/07/19   She has had a colonoscopy in 2017 which was normal   US abdomen 03/09/19 - IMPRESSION: No acute sonographic abnormality detected. No specific abnormality detected to explain the patient's abdominal pain.  Patient records arrived, they were extensive and summary as below:   3 upper endoscopies between Aug 2017 and Oct 2018 - reportedly showed H pylori gastritis, has been treated 3 times for H pylori with eradication noted on 2 tests in 2019   EGD 03/05/2017 - H pylori gastritis, small bowel biopsies normal   Esophageal manometry 11/12/2017 - normal 24 hour pH test done OFF PPI - normal   US kidneys 12/09/2017 - normal   H pylori stool antigen test 03/12/2018 - negative   H pylori breath test 05/28/2018 - negative   Fecal calprotectin 07/01/2018 - 62   RUQ [place] 07/2018 - tiny 50m gallbladder polyp, otherwise normal   Other MRIs / CT scans on disk I cannot open/ see     Past Medical History:  Diagnosis Date   Anxiety     Ear pain    right some times in the left    Gastritis    IBS (irritable bowel syndrome)    Vertigo 07/19/2019     Past Surgical History:  Procedure Laterality Date   CESAREAN SECTION     Family History  Problem Relation Age of Onset   Healthy Mother    Healthy Father    Social History   Tobacco Use   Smoking status: Never   Smokeless tobacco: Never  Vaping Use   Vaping Use: Never used  Substance Use Topics   Alcohol use: Never   Drug use: Never   Current Outpatient Medications  Medication Sig Dispense Refill   gabapentin (NEURONTIN) 300 MG capsule Take 1 capsule (300 mg total) by mouth 3 (three) times daily. (Patient taking differently: Take 900 mg by mouth 3 (three) times daily.) 270 capsule 1   omeprazole (PRILOSEC OTC) 20 MG tablet Take 20 mg by mouth daily.     medroxyPROGESTERone (PROVERA) 5 MG tablet Take one tablet a day for 5 days every other month if no spontaneous cycles. (Patient not taking: Reported on 08/28/2021) 15 tablet 3   No current facility-administered medications for this visit.   No Known Allergies   Review of Systems: All systems reviewed and negative except where noted in HPI.    Labs reviewed in care everywhere    Physical Exam: BP 90/60 (BP Location: Left Arm, Patient Position: Sitting, Cuff Size: Normal)   Pulse 84   Ht 5' 2.25" (1.581 m) Comment: height measured without shoes  Wt 127 lb 2 oz (57.7 kg)   LMP 07/12/2021   BMI 23.07 kg/m  Constitutional: Pleasant,well-developed, female in no acute distress. Neurological: Alert and oriented to person place and time. Psychiatric: Normal mood and affect. Behavior is normal.   ASSESSMENT AND PLAN: 50year old female here for reassessment of the following:  Iron deficiency anemia Dyspepsia Nausea  As above, the patient has had intermittent symptoms of abdominal discomfort nausea poor p.o. intake in multiple settings, as above.  She is had a very extensive work-up over the past  few years.  She did have H. pylori initially but was treated for that multiple studies have shown eradication since then.  Overall I thought she more than likely had a functional bowel disorder and treated dyspepsia with PPI, she also had some response to Elavil in the past.  She is doing quite well for over a year or 2 and then recurrence of symptoms recently.  Not sure if this is related to increased stress or anxiety in her life which she states is ongoing.  She is currently not being treated  for that.  Discussed options with her.  She does have an iron deficiency anemia that has recurred.  While this very well could be due to her menses, in light of her bowel symptoms, the fact that she has not had a colonoscopy in 5 years and an EGD in 4 years, I think EGD and colonoscopy is reasonable in light of the iron deficiency.  We discussed this for several minutes and following discussion of risks and benefits she wants to proceed.  In the interim she is doing much better on omeprazole once daily, but her symptoms have not resolved at this dose.  She can increase to twice daily dosing for few weeks to see if that provides any additional benefit.  We also recommended some FD guard to see if that might help her as well, she can use it as needed.  I offered her a trial of buspirone as well to treat underlying anxiety and see if that will help some dyspepsia, she wants to hold off on that if possible.  She will start ferrous sulfate once daily otherwise.  Plan: - schedule EGD and colonoscopy - Sutab - increase omeprazole to BID dosing for a few weeks - trial FD gard PRN  - I think buspirone is reasonable but she wants to hold off for now - start OTC ferrous sulfate once daily - further recommendations pending results and her course.  I spent 35 minutes of time, including in depth chart review, face-to-face time with the patient, and documenting this encounter.  Jolly Mango, MD [facility]

## 2021-08-28 NOTE — Patient Instructions (Addendum)
If you are age 50 or older, your body mass index should be between 23-30. Your Body mass index is 23.07 kg/m. If this is out of the aforementioned range listed, please consider follow up with your Primary Care Provider.  If you are age 75 or younger, your body mass index should be between 19-25. Your Body mass index is 23.07 kg/m. If this is out of the aformentioned range listed, please consider follow up with your Primary Care Provider.   ________________________________________________________  The [facility] providers would like to encourage you to use [facility] to communicate with providers for non-urgent requests or questions.  Due to long hold times on the telephone, sending your provider a message by [facility] may be a faster and more efficient way to get a response.  Please allow 48 business hours for a response.  Please remember that this is for non-urgent requests.  _______________________________________________________  Chelsea Bradshaw have been scheduled for an endoscopy and colonoscopy. Please follow the written instructions given to you at your visit today. Please pick up your prep supplies at the pharmacy within the next 1-3 days. If you use inhalers (even only as needed), please bring them with you on the day of your procedure.   Increase omeprazole to twice daily for now.  Please purchase the following medications over the counter and take as directed:  FDGard - take as directed Oral iron (ferrous sulfate) - Take once daily with food  Thank you for entrusting me with your care and for choosing  HealthCare, Dr. [place]

## 2021-08-30 ENCOUNTER — Ambulatory Visit: Payer: BC Managed Care – PPO | Admitting: Obstetrics and Gynecology

## 2021-09-06 LAB — EXTERNAL GENERIC LAB PROCEDURE: COLOGUARD: NEGATIVE

## 2021-09-06 NOTE — Telephone Encounter (Signed)
Called and spoke with patient at length. We have rescheduled her EGD/colon in the [facility] to Tuesday, 11/12/21 at 2 pm. Pt is aware that she will need to adjust the date on her instructions. Pt had several questions in regards to the recommendations. She also states that her PCP did blood work, checked for H. Pylori and blood in the stool. Advised that I do not see the results in care everywhere. Pt is going to screenshot the results and will send to Dr. Havery Moros via my chart. Answered all of patient's questions. She verbalized understanding and had no concerns at the end of the call.

## 2021-09-11 ENCOUNTER — Telehealth: Payer: Self-pay | Admitting: Gastroenterology

## 2021-09-11 NOTE — Telephone Encounter (Signed)
See telephone encounter from 09/11/21.

## 2021-09-11 NOTE — Telephone Encounter (Signed)
Thanks [employer]. We can add her to wait list for a double to do this if another opening comes open sooner. Otherwise, if her upper abdomen is the more problematic area we can do the EGD sooner and add it on first available. I have an opening on Friday AM to do it if she wishes to proceed, can you schedule her? Thanks

## 2021-09-11 NOTE — Telephone Encounter (Signed)
Returned call to patient. Pt reports that the night before last is the only night that she slept well. She reports that she has been getting a tight feeling in her upper abdomen. Patient states that she still takes Omeprazole 20 mg BID. She states that she tried the Gas-X once and it helped a little. Advised patient to continue Gas-X if it helps. Advised that she should also continue to avoid foods that cause excessive gas, I have sent those recommendations via my chart. Pt did eat beans and blueberries which she thinks started her symptoms. Patient reports that her last BM was yesterday. Pt states that she does not feel like she empties completely even though she has loose stools. We discussed that she could be constipated and have overflow diarrhea. Pt is wondering if she needs to have an EGD or colonoscopy sooner. She is aware that we have no sooner appts for a double procedure at this time, I re-checked the schedule today. Please advise on which procedure could be done first. Thanks

## 2021-09-11 NOTE — Telephone Encounter (Signed)
Returned call to patient with recommendations. We have moved her EGD appt to Friday, 09/13/21 at 11 am. Pt is aware that she will need to arrive on the 4th floor by 10 am with a care partner. Pt states that she is going to check with her husband since he will be coming back into town tomorrow. Advised patient that she will need to let us know today if she will be able to keep the Friday appt. Patient will send me a message via my chart. She is aware that I will send EGD instructions to her my chart. Advised that her colonoscopy is still scheduled for 11/12/21. Patient verbalized understanding of all information and had no other concerns at the end of the call.   Secure staff message sent to [facility] with appt date change.

## 2021-09-13 ENCOUNTER — Encounter: Payer: Self-pay | Admitting: Gastroenterology

## 2021-09-13 ENCOUNTER — Ambulatory Visit (AMBULATORY_SURGERY_CENTER): Payer: BC Managed Care – PPO | Admitting: Gastroenterology

## 2021-09-13 ENCOUNTER — Other Ambulatory Visit: Payer: Self-pay

## 2021-09-13 VITALS — BP 125/82 | HR 86 | Temp 98.2°F | Resp 13 | Ht 62.0 in | Wt 127.0 lb

## 2021-09-13 DIAGNOSIS — R112 Nausea with vomiting, unspecified: Secondary | ICD-10-CM

## 2021-09-13 DIAGNOSIS — R1013 Epigastric pain: Secondary | ICD-10-CM | POA: Diagnosis not present

## 2021-09-13 DIAGNOSIS — R140 Abdominal distension (gaseous): Secondary | ICD-10-CM | POA: Diagnosis not present

## 2021-09-13 DIAGNOSIS — D508 Other iron deficiency anemias: Secondary | ICD-10-CM

## 2021-09-13 DIAGNOSIS — K2970 Gastritis, unspecified, without bleeding: Secondary | ICD-10-CM

## 2021-09-13 DIAGNOSIS — K2950 Unspecified chronic gastritis without bleeding: Secondary | ICD-10-CM | POA: Diagnosis not present

## 2021-09-13 HISTORY — PX: UPPER GASTROINTESTINAL ENDOSCOPY: SHX188

## 2021-09-13 MED ORDER — SODIUM CHLORIDE 0.9 % IV SOLN: 500.0000 mL | Freq: Once | INTRAVENOUS | Status: DC

## 2021-09-13 MED ORDER — PANTOPRAZOLE SODIUM 40 MG PO TBEC: 40.0000 mg | DELAYED_RELEASE_TABLET | Freq: Two times a day (BID) | ORAL | 3 refills | Status: DC

## 2021-09-13 MED ORDER — SUCRALFATE 1 G PO TABS: 1.0000 g | ORAL_TABLET | Freq: Four times a day (QID) | ORAL | 1 refills | Status: DC | PRN

## 2021-09-13 NOTE — Patient Instructions (Signed)
YOU HAD AN ENDOSCOPIC PROCEDURE TODAY: Refer to the procedure report and other information in the discharge instructions given to you for any specific questions about what was found during the examination. If this information does not answer your questions, please call [place] office at [phone] to clarify.   YOU SHOULD EXPECT: Some feelings of bloating in the abdomen. Passage of more gas than usual. Walking can help get rid of the air that was put into your GI tract during the procedure and reduce the bloating. If you had a lower endoscopy (such as a colonoscopy or flexible sigmoidoscopy) you may notice spotting of blood in your stool or on the toilet paper. Some abdominal soreness may be present for a day or two, also.  DIET: Your first meal following the procedure should be a light meal and then it is ok to progress to your normal diet. A half-sandwich or bowl of soup is an example of a good first meal. Heavy or fried foods are harder to digest and may make you feel nauseous or bloated. Drink plenty of fluids but you should avoid alcoholic beverages for 24 hours. If you had a esophageal dilation, please see attached instructions for diet.    ACTIVITY: Your care partner should take you home directly after the procedure. You should plan to take it easy, moving slowly for the rest of the day. You can resume normal activity the day after the procedure however YOU SHOULD NOT DRIVE, use power tools, machinery or perform tasks that involve climbing or major physical exertion for 24 hours (because of the sedation medicines used during the test).   SYMPTOMS TO REPORT IMMEDIATELY: A gastroenterologist can be reached at any hour. Please call [phone]  for any of the following symptoms:   Following upper endoscopy (EGD, EUS, ERCP, esophageal dilation) Vomiting of blood or coffee ground material  New, significant abdominal pain  New, significant chest pain or pain under the shoulder blades  Painful or  persistently difficult swallowing  New shortness of breath  Black, tarry-looking or red, bloody stools  FOLLOW UP:  If any biopsies were taken you will be contacted by phone or by letter within the next 1-3 weeks. Call [phone]  if you have not heard about the biopsies in 3 weeks.  Please also call with any specific questions about appointments or follow up tests.  

## 2021-09-13 NOTE — Progress Notes (Signed)
Called to room to assist during endoscopic procedure.  Patient ID and intended procedure confirmed with present staff. Received instructions for my participation in the procedure from the performing physician.  

## 2021-09-13 NOTE — Progress Notes (Signed)
A and O x3. Report to RN. Tolerated MAC anesthesia well.Teeth unchanged after procedure. 

## 2021-09-13 NOTE — Progress Notes (Signed)
History and Physical Interval Note: see clinic note from 08/28/21 for full details of this case. Ongoing upper tract symptoms and recent IDA, here for EGD to further evaluate. Colonoscopy is pending at another time, EGD done first more acutely due to her symptoms. On omeprazole. No interval changes otherwise - symptoms persist. She agrees to proceed.  09/13/2021 12:06 PM  Chelsea Bradshaw  has presented today for endoscopic procedure(s), with the diagnosis of  Encounter Diagnoses  Name Primary?   Other iron deficiency anemia Yes   Dyspepsia    Nausea and vomiting, unspecified vomiting type    Bloating   .  The various methods of evaluation and treatment have been discussed with the patient and/or family. After consideration of risks, benefits and other options for treatment, the patient has consented to  the endoscopic procedure(s).   The patient's history has been reviewed, patient examined, no change in status, stable for surgery.  I have reviewed the patient's chart and labs.  Questions were answered to the patient's satisfaction.    Jolly Mango, MD [facility]

## 2021-09-13 NOTE — Op Note (Signed)
[facility] Patient Name: Chelsea Bradshaw Procedure Date: 09/13/2021 12:00 PM MRN: [id number] Endoscopist: Remo Lipps P. Havery Moros , MD Age: 50 Referring MD:  Date of Birth: 1971-07-24 Gender: Female Account #: [id number] Procedure:                Upper GI endoscopy Indications:              Iron deficiency, Dyspepsia, Nausea - on omeprazole                            recently Medicines:                Monitored Anesthesia Care Procedure:                Pre-Anesthesia Assessment:                           - Prior to the procedure, a History and Physical                            was performed, and patient medications and                            allergies were reviewed. The patient's tolerance of                            previous anesthesia was also reviewed. The risks                            and benefits of the procedure and the sedation                            options and risks were discussed with the patient.                            All questions were answered, and informed consent                            was obtained. Prior Anticoagulants: The patient has                            taken no previous anticoagulant or antiplatelet                            agents. ASA Grade Assessment: II - A patient with                            mild systemic disease. After reviewing the risks                            and benefits, the patient was deemed in                            satisfactory condition to undergo the procedure.  After obtaining informed consent, the endoscope was                            passed under direct vision. Throughout the                            procedure, the patient's blood pressure, pulse, and                            oxygen saturations were monitored continuously. The                            GIF [id number] #[id number] was introduced through the                            mouth, and advanced to the second part of  duodenum.                            The upper GI endoscopy was accomplished without                            difficulty. The patient tolerated the procedure                            well. Scope In: Scope Out: Findings:                 Esophagogastric landmarks were identified: the                            Z-line was found at 35 cm, the gastroesophageal                            junction was found at 35 cm and the upper extent of                            the gastric folds was found at 35 cm from the                            incisors.                           The exam of the esophagus was otherwise normal.                           Patchy inflammation characterized by erosions was                            found in the gastric fundus, in the gastric body                            and in the gastric antrum.                           The exam of  the stomach was otherwise normal.                           Biopsies were taken with a cold forceps in the                            gastric body, at the incisura and in the gastric                            antrum for Helicobacter pylori testing and                            histology.                           The duodenal bulb and second portion of the                            duodenum were normal. Biopsies for histology were                            taken with a cold forceps for evaluation of celiac                            disease. Complications:            No immediate complications. Estimated blood loss:                            Minimal. Estimated Blood Loss:     Estimated blood loss was minimal. Impression:               - Esophagogastric landmarks identified.                           - Normal esophagus otherwise                           - Gastritis.                           - Normal stomach otherwise - biopsies obtained                           - Normal duodenal bulb and second portion of the                             duodenum. Biopsied. Recommendation:           - Patient has a contact number available for                            emergencies. The signs and symptoms of potential                            delayed complications were discussed with the  patient. Return to normal activities tomorrow.                            Written discharge instructions were provided to the                            patient.                           - Resume previous diet.                           - Continue present medications.                           - If you think omeprazole twice daily dosing has                            helped, continue it (dose recently increased). If                            not, can try switch to another PPI. Will discuss                            with patient.                           - Add carafate 1 tab po every 6 hours as needed                           - Await pathology results. Colonscopy to be done to                            complete evaluation for iron deficiency Remo Lipps P. [name], MD 09/13/2021 12:22:39 PM This report has been signed electronically.

## 2021-09-13 NOTE — Progress Notes (Signed)
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

## 2021-09-14 ENCOUNTER — Encounter: Payer: Self-pay | Admitting: Gastroenterology

## 2021-09-15 ENCOUNTER — Encounter: Payer: Self-pay | Admitting: Neurology

## 2021-09-15 ENCOUNTER — Encounter: Payer: Self-pay | Admitting: Gastroenterology

## 2021-09-16 ENCOUNTER — Encounter: Payer: Self-pay | Admitting: Gastroenterology

## 2021-09-16 MED ORDER — BUSPIRONE HCL 7.5 MG PO TABS: 7.5000 mg | ORAL_TABLET | Freq: Two times a day (BID) | ORAL | 2 refills | Status: DC

## 2021-09-16 NOTE — Telephone Encounter (Signed)
This has been addressed. Please see alternate patient message "Brief rundown.Marland KitchenMarland Kitchen"

## 2021-09-16 NOTE — Telephone Encounter (Signed)
See note below from Dr. Havery Moros. I called and spoke with patient to review his recommendations. Pt has been advised that she can take Klonopin at night if she needs to. Advised that she should continue Carafate and Protonix as prescribed. Pt would like to try Buspirone as recommended. Pt would like RX sent to pharmacy on file. I answered all of patient's questions. Pt verbalized understanding and had no concerns at the end of the call.

## 2021-09-16 NOTE — Telephone Encounter (Signed)
Shlome Baldree I responded to one of this patient's Mychart messages.  I'd like to start her on buspirone 7.5mg  BID to see if this helps her stomach relax and hopefully she tolerates it. She should get my message and get back to [place] but can you give her a call in the next day or so and order this for her to start if she is willing to try it? Thanks

## 2021-09-17 ENCOUNTER — Telehealth: Payer: Self-pay | Admitting: *Deleted

## 2021-09-17 NOTE — Telephone Encounter (Signed)
  Follow up Call-  Call back number 09/13/2021  Post procedure Call Back phone  # [phone]  Permission to leave phone message Yes  Some recent data might be hidden     Patient questions:  Do you have a fever, pain , or abdominal swelling? No. Pain Score  0 *  Have you tolerated food without any problems? Yes.    Have you been able to return to your normal activities? Yes.    Do you have any questions about your discharge instructions: Diet   No. Medications  No. Follow up visit  No.  Do you have questions or concerns about your Care? No.  Actions: * If pain score is 4 or above: No action needed, pain <4.  Have you developed a fever since your procedure? no  2.   Have you had an respiratory symptoms (SOB or cough) since your procedure? no  3.   Have you tested positive for COVID 19 since your procedure no  4.   Have you had any family members/close contacts diagnosed with the COVID 19 since your procedure?  no   If yes to any of these questions please route to Joylene John, RN and Joella Prince, RN

## 2021-09-20 ENCOUNTER — Encounter: Payer: Self-pay | Admitting: Gastroenterology

## 2021-09-21 ENCOUNTER — Encounter: Payer: Self-pay | Admitting: Gastroenterology

## 2021-09-22 ENCOUNTER — Encounter: Payer: Self-pay | Admitting: Gastroenterology

## 2021-09-23 ENCOUNTER — Encounter: Payer: Self-pay | Admitting: Gastroenterology

## 2021-09-23 ENCOUNTER — Telehealth: Payer: Self-pay | Admitting: Gastroenterology

## 2021-09-23 NOTE — Telephone Encounter (Signed)
This has been addressed.

## 2021-09-23 NOTE — Telephone Encounter (Signed)
This has been addressed, see alternate patient message.

## 2021-09-23 NOTE — Telephone Encounter (Signed)
Patient called states she has been taking the Buspirone medication and has been feeling really nauseous is seeking advise does not know if she should stop it or continue.

## 2021-09-23 NOTE — Telephone Encounter (Signed)
This has been addressed. See patient messages

## 2021-09-24 ENCOUNTER — Encounter: Payer: Self-pay | Admitting: Gastroenterology

## 2021-09-25 ENCOUNTER — Encounter: Payer: Self-pay | Admitting: Gastroenterology

## 2021-09-26 ENCOUNTER — Encounter: Payer: Self-pay | Admitting: Gastroenterology

## 2021-09-26 NOTE — Telephone Encounter (Signed)
Dr. Havery Moros, please see patient's message. She was seen by her PCP on 11/28 and it looks like they prescribed Buspirone 5 mg. The records are in care everywhere for your review. Thanks

## 2021-09-26 NOTE — Telephone Encounter (Signed)
No refill needed at this time. Original RX for Buspirone 7.5 mg was prescribed on 11/21.

## 2021-09-28 ENCOUNTER — Encounter: Payer: Self-pay | Admitting: Gastroenterology

## 2021-09-29 ENCOUNTER — Encounter: Payer: Self-pay | Admitting: Gastroenterology

## 2021-09-30 ENCOUNTER — Encounter: Payer: Self-pay | Admitting: Gastroenterology

## 2021-09-30 NOTE — Telephone Encounter (Signed)
Called and spoke with patient. She has been scheduled for a follow up appt with Dr. Havery Moros on Thursday, 10/17/21 at 3:40 pm. I answered all of patient's questions. Pt verbalized understanding of all information and had no concerns at the end of the call.

## 2021-10-01 NOTE — Telephone Encounter (Signed)
I have reviewed patient's update.

## 2021-10-02 ENCOUNTER — Encounter: Payer: Self-pay | Admitting: Gastroenterology

## 2021-10-02 NOTE — Telephone Encounter (Signed)
Pt's concerns have been reviewed.

## 2021-10-04 ENCOUNTER — Encounter: Payer: BC Managed Care – PPO | Admitting: Gastroenterology

## 2021-10-04 ENCOUNTER — Encounter: Payer: Self-pay | Admitting: Gastroenterology

## 2021-10-04 NOTE — Telephone Encounter (Signed)
Noted  

## 2021-10-05 ENCOUNTER — Encounter: Payer: Self-pay | Admitting: Gastroenterology

## 2021-10-06 ENCOUNTER — Encounter: Payer: Self-pay | Admitting: Gastroenterology

## 2021-10-07 NOTE — Telephone Encounter (Signed)
See alternate message, pt reported that her bloating improved.

## 2021-10-10 ENCOUNTER — Other Ambulatory Visit: Payer: Self-pay

## 2021-10-10 ENCOUNTER — Telehealth: Payer: Self-pay

## 2021-10-10 ENCOUNTER — Encounter: Payer: Self-pay | Admitting: Gastroenterology

## 2021-10-10 ENCOUNTER — Other Ambulatory Visit: Payer: Self-pay | Admitting: Gastroenterology

## 2021-10-10 MED ORDER — BUSPIRONE HCL 15 MG PO TABS: 15.0000 mg | ORAL_TABLET | Freq: Two times a day (BID) | ORAL | 2 refills | Status: DC

## 2021-10-10 NOTE — Telephone Encounter (Signed)
Pt called into the office today with the same concerns that she previously sent through my chart. Discussed recommendations at length. Pt wanted a new RX for Buspar 15 mg BID. Pt states that she went to her PCP today and they also prescribed Lexapro. Advised pt to hold off on Lexapro and try increased dose of Buspar. Pt advised to take Gas-X for bloating. Pt advised to follow recommendations until her appt. Advised pt that she can try a liquid diet and increase as tolerated. Pt is aware that Dr. Havery Moros does not currently have any availability prior to her scheduled appt. Pt states that she was anxious about colonoscopy. Advised that this can be discussed with Dr. Havery Moros at the time of her appt on 12/22. Pt verbalized understanding and had no concerns at the end of the call.

## 2021-10-11 ENCOUNTER — Encounter: Payer: Self-pay | Admitting: Gastroenterology

## 2021-10-13 ENCOUNTER — Encounter: Payer: Self-pay | Admitting: Gastroenterology

## 2021-10-14 ENCOUNTER — Telehealth: Payer: Self-pay | Admitting: Gastroenterology

## 2021-10-14 NOTE — Telephone Encounter (Signed)
This has been addressed, please see other patient message.

## 2021-10-14 NOTE — Telephone Encounter (Signed)
The patients PCP DR. Windell Hummingbird called and said she would like to discuss some things with you regarding this patient and that once explain he would help understand patient at bit more.   C: [phone].  Thanks

## 2021-10-15 ENCOUNTER — Encounter: Payer: Self-pay | Admitting: Gastroenterology

## 2021-10-15 NOTE — Telephone Encounter (Signed)
I called Dr. Gale Journey, we had a good discussion about this patient's recent ailments and plan moving forward. We both feel anxiety is driving a lot of her symptoms and plan to focus on that. Plan to put her back on lexapro and wean off buspirone as she has done better with lexapro in the past. I will discuss this further when I see her in the office for follow up

## 2021-10-17 ENCOUNTER — Ambulatory Visit (INDEPENDENT_AMBULATORY_CARE_PROVIDER_SITE_OTHER): Payer: BC Managed Care – PPO | Admitting: Gastroenterology

## 2021-10-17 ENCOUNTER — Encounter: Payer: Self-pay | Admitting: Gastroenterology

## 2021-10-17 ENCOUNTER — Ambulatory Visit: Payer: BC Managed Care – PPO | Admitting: Gastroenterology

## 2021-10-17 VITALS — BP 96/68 | HR 84 | Ht 62.25 in | Wt 120.0 lb

## 2021-10-17 DIAGNOSIS — F411 Generalized anxiety disorder: Secondary | ICD-10-CM

## 2021-10-17 DIAGNOSIS — R140 Abdominal distension (gaseous): Secondary | ICD-10-CM

## 2021-10-17 DIAGNOSIS — R110 Nausea: Secondary | ICD-10-CM

## 2021-10-17 DIAGNOSIS — D509 Iron deficiency anemia, unspecified: Secondary | ICD-10-CM

## 2021-10-17 DIAGNOSIS — K9049 Malabsorption due to intolerance, not elsewhere classified: Secondary | ICD-10-CM

## 2021-10-17 MED ORDER — CLONAZEPAM 0.5 MG PO TABS: 0.2500 mg | ORAL_TABLET | Freq: Three times a day (TID) | ORAL | Status: AC

## 2021-10-17 MED ORDER — BUSPIRONE HCL 7.5 MG PO TABS: 7.5000 mg | ORAL_TABLET | Freq: Every day | ORAL | Status: DC

## 2021-10-17 MED ORDER — SUCRALFATE 1 G PO TABS: ORAL_TABLET | ORAL | 1 refills | Status: DC

## 2021-10-17 MED ORDER — PANTOPRAZOLE SODIUM 40 MG PO TBEC: DELAYED_RELEASE_TABLET | ORAL | 3 refills | Status: DC

## 2021-10-17 NOTE — Patient Instructions (Addendum)
If you are age 50 or older, your body mass index should be between 23-30. Your Body mass index is 21.77 kg/m. If this is out of the aforementioned range listed, please consider follow up with your Primary Care Provider.  If you are age 30 or younger, your body mass index should be between 19-25. Your Body mass index is 21.77 kg/m. If this is out of the aformentioned range listed, please consider follow up with your Primary Care Provider.   ________________________________________________________  The [facility] providers would like to encourage you to use [facility] to communicate with providers for non-urgent requests or questions.  Due to long hold times on the telephone, sending your provider a message by [facility] may be a faster and more efficient way to get a response.  Please allow 48 business hours for a response.  Please remember that this is for non-urgent requests.  _______________________________________________________  Decrease pantoprazole to 20mg  one tablet twice daily for 2 weeks, then 20mg  daily for 2 weeks, then use as needed.  Decrease Carafate to one tablet daily for several days, then use only as needed.  As directed per PCP, START: Lexapro 10 mg daily.  START: Klonopin three times daily.  Use Zofran as needed for nausea.  Keep colonoscopy scheduled for 11-12-21 at 2pm.  It was a pleasure to see you today!  Thank you for trusting me with your gastrointestinal care!

## 2021-10-17 NOTE — Progress Notes (Signed)
HPI :  50 year old female here for a follow-up visit, accompanied by her husband today.  I last saw her November 2 where she reestablished care for multiple issues. Please see her intake visit from 02/24/19. Recall she has had issues with nausea / abdominal bloating, decreased appetite, in the setting of severe R ear pain and severe anxiety, for which she has had an extensive evaluation in multiple locations over recent years. She has had episodes of these symptoms in the past when living in [place] and [place], workup as outlined below.    See her last note for full details of her case.  Since her last visit she complained of multiple similar symptoms including nausea, dyspepsia.  She was noted to have a mild iron deficiency and underwent an EGD due to that.  Her exam was done November 18.  She had some mild gastritis, biopsies negative for H. pylori.  I tested her for celiac disease with duodenal biopsies that were also negative.  Placed her on a trial of high-dose Protonix 40 mg twice daily as well as Carafate as needed.  Symptoms had persisted on that regimen.  I tried her on some BuSpar 7.5 mg twice daily in the interim.  Overall her symptoms largely persist.  She has poor appetite, ongoing intermittent nausea but no vomiting.  She has a squeezing sensation in her upper abdomen which can come and go as well as intermittent bloating.  She has tried restricting her diet in many ways to include low FODMAP diet, gluten-free, etc.  She does not find that many of these interventions have really helped her much with the exception of gluten-free tends to reduce her bloating a bit.  She denies any reflux or heartburn symptoms.  She has not been eating any dairy.  She is concerned about what she can eat at this point.  Her bowel function has ranged for some slight loose stools to slightly constipated.  She thinks the Carafate has made her more constipated lately.  She did not tolerate trial of oral iron in the  past.  She inquires about using probiotics, she has not used that yet.  She has had some irregular menstrual cycles which has been thought to be the cause of her iron deficiency but we have scheduled her for colonoscopy given its been 5 years since her last exam to make sure nothing is changed.  She has a significant amount of anxiety that she endorses related to the symptoms and how to cope with them.  She has been followed closely by her primary care, Windell Hummingbird, whom I spoke on the phone to the other day about her case.  Dr. Gale Journey feels that the anxiety is a major issue likely causing the symptoms and we have discussed plan to treat that.  She had wanted to resume Lexapro which has helped this patient in the past but she has not started taking it yet.  It was also recommended that she increase her Klonopin to 3 times daily which she has not yet done.  The patient was given some Zofran for nausea which she has not started yet.  She has been sending frequent MyChart messages to her providers for the symptoms.  She has a family friend who is a GI physician who discussed screening for gallstones and sending a fecal calprotectin which she apparently had done recently.  She has had a few ultrasounds in the past few years which have showed no gallstones.  She has underwent  endoscopy and multiple imaging studies over the past few years for the symptoms which have failed to reveal any clear pathology other than history of H. pylori which was treated and eradicated.    H  Breath test negative 02/07/19   She has had a colonoscopy in 2017 which was normal   US abdomen 03/09/19 - IMPRESSION: No acute sonographic abnormality detected. No specific abnormality detected to explain the patient's abdominal pain.   Patient records arrived, they were extensive and summary as below:   3 upper endoscopies between Aug 2017 and Oct 2018 - reportedly showed H pylori gastritis, has been treated 3 times for H pylori with  eradication noted on 2 tests in 2019   EGD 03/05/2017 - H pylori gastritis, small bowel biopsies normal   Esophageal manometry 11/12/2017 - normal 24 hour pH test done OFF PPI - normal   US kidneys 12/09/2017 - normal   H pylori stool antigen test 03/12/2018 - negative   H pylori breath test 05/28/2018 - negative   Fecal calprotectin 07/01/2018 - 62   RUQ [place] 07/2018 - tiny 44mm gallbladder polyp, otherwise normal   Other MRIs / CT scans on disk I cannot open/ see    EGD 09/13/21 -  - The exam of the esophagus was otherwise normal. - Patchy inflammation characterized by erosions was found in the gastric fundus, in the gastric body and in the gastric antrum. - The exam of the stomach was otherwise normal. - Biopsies were taken with a cold forceps in the gastric body, at the incisura and in the gastric antrum for Helicobacter pylori testing and histology. - The duodenal bulb and second portion of the duodenum were normal. Biopsies for histology were taken with a cold forceps for evaluation of celiac disease.   1. Surgical [P], duodenal bulb and 2nd portion of duodenum - BENIGN DUODENAL MUCOSA - NO ACUTE INFLAMMATION, VILLOUS BLUNTING OR INCREASED INTRAEPITHELIAL LYMPHOCYTES IDENTIFIED 2. Surgical [P], gastric antrum and gastric body - MILD CHRONIC GASTRITIS WITH REACTIVE CHANGES - NO H. 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 05/2020   09-13-21--pt states burning pain of tongue since covid   Ear pain    right some times in the left    Gastritis    IBS (irritable bowel syndrome)    Vertigo 07/19/2019     Past Surgical History:  Procedure Laterality Date   CESAREAN SECTION     UPPER GASTROINTESTINAL ENDOSCOPY  09/13/2021   2019-[place], 2017 had 2   Family History  Problem Relation Age of Onset   Healthy Mother    Healthy Father    Colon  cancer Neg Hx    Colon polyps Neg Hx    Esophageal cancer Neg Hx    Rectal cancer Neg Hx    Stomach cancer Neg Hx    Social History   Tobacco Use   Smoking status: Never   Smokeless tobacco: Never  Vaping Use   Vaping Use: Never used  Substance Use Topics   Alcohol use: Not Currently    Comment: wine occasionally couple times a year   Drug use: Never   Current Outpatient Medications  Medication Sig Dispense Refill   busPIRone (BUSPAR) 7.5 MG tablet Take 7.5 mg by mouth 2 (two) times daily.     clonazePAM (KLONOPIN) 0.5 MG tablet Take 0.25 mg by mouth at bedtime.  gabapentin (NEURONTIN) 100 MG capsule Take 2 capsules by mouth 2 (two) times daily.     ondansetron (ZOFRAN) 4 MG tablet Take 4 mg by mouth daily as needed.     pantoprazole (PROTONIX) 40 MG tablet Take 1 tablet (40 mg total) by mouth 2 (two) times daily. 60 tablet 3   sucralfate (CARAFATE) 1 g tablet Take 1 tablet (1 g total) by mouth every 6 (six) hours as needed. 60 tablet 1   escitalopram (LEXAPRO) 10 MG tablet Take 10 mg by mouth daily. (Patient not taking: Reported on 10/17/2021)     Vitamin D, Ergocalciferol, (DRISDOL) 1.25 MG (50000 UNIT) CAPS capsule Take 50,000 Units by mouth once a week. (Patient not taking: Reported on 10/17/2021)     No current facility-administered medications for this visit.   No Known Allergies   Review of Systems: All systems reviewed and negative except where noted in HPI.   Labs in [facility]   Physical Exam: BP 96/68 (BP Location: Left Arm, Patient Position: Sitting, Cuff Size: Normal)    Pulse 84    Ht 5' 2.25" (1.581 m)    Wt 120 lb (54.4 kg)    BMI 21.77 kg/m  Constitutional: Pleasant,well-developed, female in no acute distress. Neurological: Alert and oriented to person place and time. Psychiatric: Normal mood and affect. Behavior is normal.   ASSESSMENT AND PLAN: 50 year old female here for reassessment of following:  Anxiety Nausea Bloating Food  intolerance Iron deficiency - mild -likely due to menstrual blood loss  Extensive history as outlined above.  Patient has more recently undergone EGD, lab work-up, treatment with high-dose PPI and Carafate as well as BuSpar, none of which have made a significant difference.  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  We discussed what this is and strongly recommend more aggressive treatment for her anxiety.  I have spoken with her primary care about this.  She feels that Lexapro has helped in the past and plan on resuming that now at 10 mg daily.  I counseled that can take her 4 to 6 weeks to notice a benefit.  I will try to simplify her medication regimen.  I do not think she needs chronic high-dose PPI and can decrease her Protonix to 20 mg twice daily for 2 weeks and then 20 mg daily for 2 weeks and then she can use it as needed.  The Carafate may be constipating her which can lead to bloating and thus we will decrease her Carafate to use daily and then titrate down to as needed (she does not want to stop it altogether).  I encouraged her to use Zofran for her nausea.  I defer the BuSpar decision to her primary care but I think okay to decrease to once daily as she does not think it helped so far and in order to minimize her medications.  We are planning to do a colonoscopy for her in January in light of her IDA however she has had multiple stool test negative for blood and I suspect her IDA is more likely due to menstrual blood loss but given her symptoms and her last colonoscopy had been 5 years ago we will make sure nothing concerning.  I reassured her that I do not think she has biliary colic based on symptoms and the lack of gallstones on imaging on multiple studies  in recent years.  Hopefully with more time and aggressive management of her anxiety she will improve.  I encouraged  her to get back to her prior diet and not eliminate as many foods as she is especially if elimination diet has not provided much of any significant difference yet.  She does not have celiac disease but if she feels gluten free helps that is fine to continue it. Patient and husband agree with the plan and I answered their questions, provided reassurance.  Plan: - start Lexapro 10mg  / day - increase klonopin to three times daily - decrease buspar to 7.5mg  / day - defer titration of this or stopping it to her PCP - decrease protonix to 20mg  BID (from 40mg  BID) for 2 weeks and then once daily for 2 weeks, and then use PRN - decrease carafate to once daily for a few weeks then stop and use PRN - encouraged her to use Zofran PRN for nausea - await colonoscopy scheduled for January. She should use Zofran to help her get through the prep - she will have follow up CBC and iron studies with her PCP  I spent 45 minutes of time, including in depth chart review, face-to-face time with the patient, speaking with her primary care about her case, and documenting this encounter.  Jolly Mango, MD [facility]  CC: Mancel Bale, PA-C

## 2021-10-18 ENCOUNTER — Encounter: Payer: Self-pay | Admitting: Gastroenterology

## 2021-10-21 ENCOUNTER — Encounter: Payer: Self-pay | Admitting: Gastroenterology

## 2021-10-22 ENCOUNTER — Other Ambulatory Visit: Payer: Self-pay

## 2021-10-23 MED ORDER — PANTOPRAZOLE SODIUM 20 MG PO TBEC: DELAYED_RELEASE_TABLET | ORAL | 0 refills | Status: DC

## 2021-10-24 ENCOUNTER — Encounter: Payer: Self-pay | Admitting: Gastroenterology

## 2021-10-24 MED ORDER — SUCRALFATE 1 G PO TABS: ORAL_TABLET | ORAL | 1 refills | Status: DC

## 2021-10-25 ENCOUNTER — Encounter: Payer: Self-pay | Admitting: Gastroenterology

## 2021-10-25 NOTE — Telephone Encounter (Signed)
Please see additional my chart messages from patient:  Also one request Nurse [facility] can you send the prep  procedure once again because the earlier one was for a different date so its all mixed up like the time etc. How to prepare for it? With timings  Plus do you think this can be scheduled earlier. I never get through to the front office actually.  Regards anu   Nurse [facility] when will Doc Armbruster come back to Office? Will he get to see and read all my messages What I have been experiencing since I saw him. I did fairly well for a whole week when I was constipated  and now again after having motions very uneasy.  Regards anu

## 2021-10-25 NOTE — Telephone Encounter (Signed)
This information has been added to previous my chart stream

## 2021-10-25 NOTE — Telephone Encounter (Signed)
This information has been added to a previous my chart message stream.

## 2021-10-25 NOTE — Telephone Encounter (Signed)
This is a duplicate message. See alternate patient message

## 2021-10-26 ENCOUNTER — Encounter: Payer: Self-pay | Admitting: Gastroenterology

## 2021-10-27 ENCOUNTER — Encounter: Payer: Self-pay | Admitting: Gastroenterology

## 2021-10-28 ENCOUNTER — Encounter: Payer: Self-pay | Admitting: Gastroenterology

## 2021-10-29 ENCOUNTER — Telehealth: Payer: Self-pay | Admitting: Gastroenterology

## 2021-10-29 ENCOUNTER — Encounter: Payer: Self-pay | Admitting: Gastroenterology

## 2021-10-29 DIAGNOSIS — R110 Nausea: Secondary | ICD-10-CM

## 2021-10-29 DIAGNOSIS — R1084 Generalized abdominal pain: Secondary | ICD-10-CM

## 2021-10-29 DIAGNOSIS — R140 Abdominal distension (gaseous): Secondary | ICD-10-CM

## 2021-10-29 NOTE — Telephone Encounter (Signed)
Patient has been scheduled for a CT scan on Monday, 11/04/21 at 9 am. Pt will need to arrive at [facility] by 8:30 am. Pt will need to pick up contrast this week. Pt will need to be NPO 4 hours prior. Pt will drink one bottle of contrast at 7 am and second bottle at 9 am. My chart message sent to patient with CT appt information.

## 2021-10-29 NOTE — Addendum Note (Signed)
Addended by: Yevette Edwards on: 10/29/2021 04:43 PM   Modules accepted: Orders

## 2021-10-29 NOTE — Telephone Encounter (Signed)
Patient sent 14 Mychart messages over holiday weekend that I just saw today. I tried calling her but no answer. Not able to leave a voice mail, her machine is full. I sent her a response via Mychart message in response to her last message.  Chelsea Bradshaw can you please order her a CT Scan abdomen / pelvis with contrast and coordinate with her to hopefully be done soon (she has had imaging in the past reportedly but none on file here)  I told her to take Zofran every 6 hours if she needs to for the nausea but if not working can try phenergan 12.5mg  every 6 hours to see if that will help. Please let her know that phenergan may make her drowsi. Need to get her nausea under control in order to do the bowel prep for her colonoscopy.   Thanks

## 2021-10-30 ENCOUNTER — Encounter: Payer: Self-pay | Admitting: Gastroenterology

## 2021-10-30 ENCOUNTER — Telehealth: Payer: Self-pay

## 2021-10-30 NOTE — Telephone Encounter (Signed)
I called and spoke with patient for 32 minutes this morning. We discussed her multiple concerns that she had sent via my chart. I tried to reassure patient that we are doing everything that we can to treat her symptoms. Pt states that her PCP prescribed her Promethazine to take for the nausea. She stated that the Zofran caused her to have a headache, I advised her to discontinue since it is not helpful. I told patient that she does have to follow the recommendations consistently before she may notice a difference in her symptoms. I told her that she can't continuously adjust medication dosages because we won't know what is working and what is not. Pt states that today she is supposed to decrease her Pantoprazole 20 mg to once a day. I advised pt that she can try adding Pepcid at bedtime for a few nights to see if that helps. I encouraged pt to watch her diet, sleep elevated at night, avoid any irritants (raw vegetables, caffeine, dairy), I recommended the BRAT diet and Boost or Ensure (water-based) since she mentioned weight loss.  She states that she has been tolerating eggs, toast, chicken soup, and some liquids. She states that the nausea is what wakes her up, she also has belching and gas. I told pt that she can take Gas-x OTC as needed. I also told her to resume the Carafate if she found that helpful. I told pt that she needs to take a deep breath and not stress about each symptom individually because this can worsen her symptoms. I encouraged her to take her to take all medications as prescribed by her PCP. If no improvement after adding Pepcid 20 mg at bedtime then I told her to start the promethazine. Pt is aware that Dr. Havery Moros knows about her previous scans. I told her that we do not have any scans within our EMR so it would be best to have a recent scan on file. She was worried that she would not be able to drink the contrast. I told her that she can begin to drink the contrast slowly and if she  begins to feel too nauseated or begins to vomit she will need to call us that morning, not send a my chart message. She is aware that we are still proceeding with the colonoscopy as scheduled as well. Pt's my chart message stated that she was only passing "pellets", I told her that she may feel better if she empties her bowel completely. I told her that might help her gas. Pt states that she didn't try Miralax because she had 3 large episodes of diarrhea after being constipated for 6 days. Pt knows that the CT scan and colonoscopy are the next steps to further evaluate her symptoms. Pt wanted to know if she could have all of the testing done at the hospital. I told her that she would have to go through the ED to be admitted and they would order tests as they deem necessary, I told her that was her choice. I did tell her that we get her messages but when she sends several different messages a day it makes it difficult to keep up with her symptoms and the timeline. I told her that you try to respond to every message that you can in a timely manner. I did tell her that we have several other patients that send messages as well, not just her. Pt stated that she had several hobbies, but doesn't feel like she can do  them comfortably due to her nausea. I told pt that she may want to get back into her hobbies to get her mind off of her symptoms. Pt wanted to know if you had any recommendations about her fecal calprotectin results. They are available in care everywhere. Pt states that she will see her PCP again this week. Pt verbalized understanding of all of the information. She was thankful for the call.

## 2021-10-30 NOTE — Telephone Encounter (Signed)
See 10/30/21 phone note. Pt is aware of CT appt.

## 2021-10-30 NOTE — Telephone Encounter (Signed)
Brooklyn thank you very much for taking the time to call her and document this. Agree with your recommendations. Given her persistent symptoms and lack of any imaging we have access to historically, I do think CT scan abdomen / pelvis with contrast is indicated. If she can't tolerate the oral contrast we will do it without. I do not think she needs to be admitted for these exams, we can get her through her bowel prep hopefully as an outpatient with the antiemetics.  Agree it will take some time for her anxiety medication to kick in - lexapro I believe she is on, and should continue that and not alter the dose. The fecal calprotectin will be evaluated with the colonoscopy. Thank you

## 2021-10-30 NOTE — Telephone Encounter (Signed)
My chart message sent to patient with this information 

## 2021-10-30 NOTE — Telephone Encounter (Signed)
See 1/4 telephone encounter

## 2021-10-31 ENCOUNTER — Encounter: Payer: Self-pay | Admitting: Gastroenterology

## 2021-10-31 NOTE — Telephone Encounter (Signed)
This has been addressed, please see alternate message

## 2021-11-01 ENCOUNTER — Encounter: Payer: Self-pay | Admitting: Gastroenterology

## 2021-11-01 ENCOUNTER — Telehealth: Payer: Self-pay | Admitting: Gastroenterology

## 2021-11-01 NOTE — Telephone Encounter (Signed)
CT appt cancelled. My chart message sent to patient with update.

## 2021-11-01 NOTE — Telephone Encounter (Signed)
Chelsea Bradshaw PCP called stating that Patient did not want to have  CT done on 1/9 due to being scared of the radiation. With her being scared about it has caused her to have severe anxiety.   Patient agreed to having colon procedure.  PCP requesting a call back, to discuss about patient. Please advise.

## 2021-11-01 NOTE — Telephone Encounter (Signed)
I called Chelsea Bradshaw and we discussed this patient's case.  We discussed her course, my recent visit with her, I provided reassurance at the last visit and agreed with treating her anxiety as the likely cause of her symptoms.  The patient has since sent high-volume of MyChart messages with similar complaints with persistent symptoms.  To provide peace of mind I offered her CT scan given her imaging has been at other centers that I cannot access, last being a few years ago.  Patient is anxious about the imaging.  Spoke with Chelsea Bradshaw, we will plan on doing the colonoscopy as scheduled.  We are going to focus on having the patient stick with the anxiety management plan for at least 4 to 6 weeks prior to making any changes in her regimen to give that some time to kick in and see if that will help.  If despite this her symptoms are persistent we will then proceed with CT imaging if the patient is agreeable.  Chelsea Bradshaw can you please cancel this patient's CT scan as above, she will continue on with a colonoscopy as scheduled.  Can you please send her a message letting her know that I spoke with Chelsea Bradshaw and we are holding off on CT scan and awaiting her course with the Lexapro and awaiting her colonoscopy.  Thanks

## 2021-11-02 ENCOUNTER — Other Ambulatory Visit: Payer: Self-pay | Admitting: Gastroenterology

## 2021-11-03 ENCOUNTER — Encounter: Payer: Self-pay | Admitting: Gastroenterology

## 2021-11-04 ENCOUNTER — Ambulatory Visit (HOSPITAL_BASED_OUTPATIENT_CLINIC_OR_DEPARTMENT_OTHER): Payer: BC Managed Care – PPO

## 2021-11-05 ENCOUNTER — Encounter: Payer: Self-pay | Admitting: Certified Registered Nurse Anesthetist

## 2021-11-05 ENCOUNTER — Encounter: Payer: Self-pay | Admitting: Gastroenterology

## 2021-11-05 ENCOUNTER — Telehealth: Payer: Self-pay

## 2021-11-05 NOTE — Telephone Encounter (Signed)
Called pt to remind her of her procedure on 11/06/21. Pt stated that she is bloated "very badly", and is wanting to speak with nurse about medications, and bloating.

## 2021-11-05 NOTE — Telephone Encounter (Signed)
PT is for colonoscopy in the morning. She is very concerned about taking the prep and being more "bloated and nauseated." Reviewed Sutab instructions with the patient and husband. Pt has Zofran and Phenergan rx available for nausea during the prep. Pt's husband was told to call the on call provider tonight if needed and he verbalized understanding.

## 2021-11-06 ENCOUNTER — Other Ambulatory Visit: Payer: Self-pay | Admitting: Gastroenterology

## 2021-11-06 ENCOUNTER — Ambulatory Visit (AMBULATORY_SURGERY_CENTER): Payer: BC Managed Care – PPO | Admitting: Gastroenterology

## 2021-11-06 ENCOUNTER — Other Ambulatory Visit (INDEPENDENT_AMBULATORY_CARE_PROVIDER_SITE_OTHER): Payer: BC Managed Care – PPO

## 2021-11-06 ENCOUNTER — Encounter: Payer: Self-pay | Admitting: Gastroenterology

## 2021-11-06 ENCOUNTER — Other Ambulatory Visit: Payer: Self-pay

## 2021-11-06 ENCOUNTER — Other Ambulatory Visit: Payer: Self-pay | Admitting: *Deleted

## 2021-11-06 VITALS — BP 111/62 | HR 68 | Temp 97.3°F | Resp 11 | Ht 62.0 in | Wt 120.0 lb

## 2021-11-06 DIAGNOSIS — R110 Nausea: Secondary | ICD-10-CM

## 2021-11-06 DIAGNOSIS — K5900 Constipation, unspecified: Secondary | ICD-10-CM | POA: Diagnosis not present

## 2021-11-06 DIAGNOSIS — D509 Iron deficiency anemia, unspecified: Secondary | ICD-10-CM

## 2021-11-06 DIAGNOSIS — F411 Generalized anxiety disorder: Secondary | ICD-10-CM

## 2021-11-06 DIAGNOSIS — D123 Benign neoplasm of transverse colon: Secondary | ICD-10-CM

## 2021-11-06 DIAGNOSIS — K5730 Diverticulosis of large intestine without perforation or abscess without bleeding: Secondary | ICD-10-CM | POA: Diagnosis not present

## 2021-11-06 DIAGNOSIS — R195 Other fecal abnormalities: Secondary | ICD-10-CM

## 2021-11-06 DIAGNOSIS — D127 Benign neoplasm of rectosigmoid junction: Secondary | ICD-10-CM | POA: Diagnosis not present

## 2021-11-06 DIAGNOSIS — K5289 Other specified noninfective gastroenteritis and colitis: Secondary | ICD-10-CM

## 2021-11-06 DIAGNOSIS — K529 Noninfective gastroenteritis and colitis, unspecified: Secondary | ICD-10-CM | POA: Diagnosis not present

## 2021-11-06 DIAGNOSIS — K635 Polyp of colon: Secondary | ICD-10-CM | POA: Diagnosis not present

## 2021-11-06 LAB — CBC WITH DIFFERENTIAL/PLATELET
Basophils Absolute: 0 10*3/uL (ref 0.0–0.1)
Basophils Relative: 0.5 % (ref 0.0–3.0)
Eosinophils Absolute: 0 10*3/uL (ref 0.0–0.7)
Eosinophils Relative: 1.1 % (ref 0.0–5.0)
HCT: 35.6 % — ABNORMAL LOW (ref 36.0–46.0)
Hemoglobin: 11.8 g/dL — ABNORMAL LOW (ref 12.0–15.0)
Lymphocytes Relative: 26.1 % (ref 12.0–46.0)
Lymphs Abs: 1.2 10*3/uL (ref 0.7–4.0)
MCHC: 33.3 g/dL (ref 30.0–36.0)
MCV: 89.8 fl (ref 78.0–100.0)
Monocytes Absolute: 0.3 10*3/uL (ref 0.1–1.0)
Monocytes Relative: 6 % (ref 3.0–12.0)
Neutro Abs: 3 10*3/uL (ref 1.4–7.7)
Neutrophils Relative %: 66.3 % (ref 43.0–77.0)
Platelets: 324 10*3/uL (ref 150.0–400.0)
RBC: 3.96 Mil/uL (ref 3.87–5.11)
RDW: 13.5 % (ref 11.5–15.5)
WBC: 4.6 10*3/uL (ref 4.0–10.5)

## 2021-11-06 LAB — COMPREHENSIVE METABOLIC PANEL
ALT: 13 U/L (ref 0–35)
AST: 12 U/L (ref 0–37)
Albumin: 3.9 g/dL (ref 3.5–5.2)
Alkaline Phosphatase: 52 U/L (ref 39–117)
BUN: 8 mg/dL (ref 6–23)
CO2: 23 mEq/L (ref 19–32)
Calcium: 8.7 mg/dL (ref 8.4–10.5)
Chloride: 107 mEq/L (ref 96–112)
Creatinine, Ser: 0.69 mg/dL (ref 0.40–1.20)
GFR: 101.26 mL/min (ref >60.00)
Glucose, Bld: 73 mg/dL (ref 70–99)
Potassium: 3.7 mEq/L (ref 3.5–5.1)
Sodium: 140 mEq/L (ref 135–145)
Total Bilirubin: 0.5 mg/dL (ref 0.2–1.2)
Total Protein: 6.7 g/dL (ref 6.0–8.3)

## 2021-11-06 LAB — IBC + FERRITIN
Ferritin: 7.8 ng/mL — ABNORMAL LOW (ref 10.0–291.0)
Iron: 68 ug/dL (ref 42–145)
Saturation Ratios: 19.8 % — ABNORMAL LOW (ref 20.0–50.0)
TIBC: 343 ug/dL (ref 250.0–450.0)
Transferrin: 245 mg/dL (ref 212.0–360.0)

## 2021-11-06 MED ORDER — ONDANSETRON HCL 4 MG PO TABS: 4.0000 mg | ORAL_TABLET | Freq: Four times a day (QID) | ORAL | 1 refills | Status: DC | PRN

## 2021-11-06 MED ORDER — SODIUM CHLORIDE 0.9 % IV SOLN: 500.0000 mL | Freq: Once | INTRAVENOUS | Status: DC

## 2021-11-06 NOTE — Progress Notes (Signed)
Attempted IV x 2, R a/c and R hand.  Patient unable to tolerate advancement of catheter and begged for me to remove it.  Explained necessity of IV.  Will defer to CRNA for IV access.

## 2021-11-06 NOTE — Progress Notes (Signed)
[facility] History and Physical   Primary Care Physician:  Mancel Bale, PA-C   Reason for Procedure:   History of iron deficiency anemia, (+) fecal calprotectin   Plan:    colonoscopy     HPI: Chelsea Bradshaw is a 51 y.o. female  here for colonoscopy to evaluate history of mild IDA noted in September. EGD done showing gastritis. Chronic nausea, dyspepsia for months. Symptoms persist despite management. She has severe anxiety that is poorly controlled, have spoken with her primary MD about this who feels anxiety is causing her symptoms but colonoscopy done to clear her lower tract in light of IDA. She has felt this way for a few months. She states she did okay with the prep and drank the whole thing. Initially stated to staff she wanted to go to the hospital, but states symptoms are no different from baseline. Spoke with patient and husband at length in preop area. Her vitals are stable, IV placed to give fluids. We discussed colonoscopy and anesthesia and if she wanted to proceed today - long discussion about this, patient and husband feel she is at baseline and want to get this exam done. Her abdomen is benign on exam, she is not in any pain. Weak from prep which was hard for her. If colonoscopy negative we will plan on cross sectional imaging with CT to further evaluate symptoms which persist and repeat some labs as well, in conjunction with more aggressive management of her anxiety. She agreed with the plan, all questions answered.     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 Authorizing Provider  clonazePAM (KLONOPIN) 0.5 MG tablet Take 0.5 tablets (0.25 mg total) by mouth in the morning, at noon, and at bedtime. 10/17/21  Yes Chanon Loney, Carlota Raspberry, MD  escitalopram (LEXAPRO) 10 MG tablet Take 10 mg by mouth daily. 10/10/21  Yes [provider]  gabapentin (NEURONTIN) 100 MG capsule Take 2 capsules by mouth 2 (two) times daily. 10/14/21  Yes [provider]  ondansetron (ZOFRAN) 4 MG tablet Take 4 mg by mouth daily as needed. 10/10/21  Yes [provider]  pantoprazole (PROTONIX) 20 MG tablet Take 1 tablet (20 mg total) by mouth 2 (two) times daily for 14 days, THEN 1 tablet (20 mg total) daily for 14 days, THEN 1 tablet (20 mg total) as needed. 10/23/21 12/20/21 Yes Kristen Bushway, Carlota Raspberry, MD  promethazine (PHENERGAN) 12.5 MG tablet Take 12.5-25 mg by mouth every 6 (six) hours as needed. 10/29/21  Yes [provider]  sucralfate (CARAFATE) 1 g tablet Decrease Carafate to one tablet daily for several days, then use only as needed. 10/24/21  Yes Malayah Demuro, Carlota Raspberry, MD  Vitamin D, Ergocalciferol, (DRISDOL) 1.25 MG (50000 UNIT) CAPS capsule Take 50,000 Units by mouth once a week. 10/14/21  Yes [provider]  busPIRone (BUSPAR) 7.5 MG tablet Take 1 tablet (7.5 mg total) by mouth daily. 10/17/21  Yetta Flock, MD    Current Outpatient Medications  Medication Sig Dispense Refill   clonazePAM (KLONOPIN) 0.5 MG tablet Take 0.5 tablets (0.25 mg total) by mouth in the morning, at noon, and at bedtime. 30 tablet    escitalopram (LEXAPRO) 10 MG tablet Take 10 mg by mouth daily.     gabapentin (NEURONTIN) 100 MG capsule Take 2 capsules by mouth 2 (two) times daily.     ondansetron (ZOFRAN) 4 MG tablet Take 4 mg by mouth daily as needed.     pantoprazole (PROTONIX) 20 MG tablet Take 1 tablet (20 mg total) by mouth 2 (two) times daily for 14 days,  THEN 1 tablet (20 mg total) daily for 14 days, THEN 1 tablet (20 mg total) as needed. 60 tablet 0   promethazine (PHENERGAN) 12.5 MG tablet Take 12.5-25 mg by mouth every 6 (six) hours as needed.     sucralfate (CARAFATE) 1 g tablet Decrease Carafate to one tablet daily for several days, then use only as needed. 60 tablet 1   Vitamin D, Ergocalciferol, (DRISDOL) 1.25 MG (50000 UNIT) CAPS capsule Take 50,000 Units by mouth once a week.     busPIRone (BUSPAR) 7.5 MG tablet Take 1 tablet (7.5 mg total) by mouth daily.     Current Facility-Administered Medications  Medication Dose Route Frequency Provider Last Rate Last Admin   0.9 %  sodium chloride infusion  500 mL Intravenous Once Donnavin Vandenbrink, Carlota Raspberry, MD        Allergies as of 11/06/2021   (No Known Allergies)    Family History  Problem Relation Age of Onset   Healthy Mother    Healthy Father    Colon cancer Neg Hx    Colon polyps Neg Hx    Esophageal cancer Neg Hx    Rectal cancer Neg Hx    Stomach cancer Neg Hx     Social History   Socioeconomic History   Marital status: Married    Spouse name: Not on file   Number of children: 1   Years of education: Not on file   Highest education level: Not on file  Occupational History   Not on file  Tobacco Use   Smoking status: Never   Smokeless tobacco: Never  Vaping Use   Vaping Use: Never used  Substance and Sexual Activity   Alcohol use: Not Currently    Comment: wine occasionally couple times a year   Drug use: Never   Sexual activity: Not Currently  Other Topics Concern   Not on file  Social History Narrative   Right handed    Caffeine 2 cups per day.    Social Determinants of Health   Financial Resource Strain: Not on file  Food Insecurity: Not on file  Transportation Needs: Not on file  Physical Activity: Not on file  Stress: Not on file  Social Connections: Not on file  Intimate Partner Violence: Not on file    Review of Systems: All other review of  systems negative except as mentioned in the HPI.  Physical Exam: Vital signs BP 135/86    Pulse 82    Temp (!) 97.3 F (36.3 C) (Skin)    Resp 20    Ht 5\' 2"  (1.575 m)    Wt 120 lb (54.4 kg)    SpO2 100%    BMI 21.95 kg/m   General:   Alert,  pleasant and cooperative in NAD Lungs:  Clear throughout to auscultation.   Heart:  Regular rate and rhythm Abdomen:  Soft, nontender and nondistended.   Neuro/Psych:  Alert and cooperative. Normal mood and affect. A and O x 3  Jolly Mango, MD [facility]

## 2021-11-06 NOTE — Progress Notes (Signed)
0748 Pt rolling side to side, stating she wants to go to hospital due to nausea and abd pain. Denies chest, arm, and jaw pain, staff told to call Dr Havery Moros about patient condition and status. Vital signs: 117/73, pulse 83 and O2 sat 100% on room air.

## 2021-11-06 NOTE — Progress Notes (Signed)
[place] Dr Havery Moros spoke with patient and clear to do colonscopy, denies pt needs to go to hospital.

## 2021-11-06 NOTE — Progress Notes (Signed)
Report given to PACU, vss 

## 2021-11-06 NOTE — Telephone Encounter (Signed)
Rx for Zofran refilled. Ambulatory referral to Acupuntu[facility] in epic.  Orders for Ferrelecit in epic. Patient notified of results via my chart.

## 2021-11-06 NOTE — Progress Notes (Signed)
Pt's states no medical or surgical changes since previsit or office visit.  Patient is very anxious and feeble. She is grasping my arm and asking me not to leave her and states that she needs to go to the hospital. Patient is moaning and seems distressed.  Dr. Havery Moros notified of situation and will come and speak to the patient. Husband called back to sit with patient to ease anxiety.

## 2021-11-06 NOTE — Patient Instructions (Addendum)
Handouts Provided:  Polyps and Diverticulosis  Labs today  CT Contrast provided  YOU HAD AN ENDOSCOPIC PROCEDURE TODAY AT [facility]:   Refer to the procedure report that was given to you for any specific questions about what was found during the examination.  If the procedure report does not answer your questions, please call your gastroenterologist to clarify.  If you requested that your care partner not be given the details of your procedure findings, then the procedure report has been included in a sealed envelope for you to review at your convenience later.  YOU SHOULD EXPECT: Some feelings of bloating in the abdomen. Passage of more gas than usual.  Walking can help get rid of the air that was put into your GI tract during the procedure and reduce the bloating. If you had a lower endoscopy (such as a colonoscopy or flexible sigmoidoscopy) you may notice spotting of blood in your stool or on the toilet paper. If you underwent a bowel prep for your procedure, you may not have a normal bowel movement for a few days.  Please Note:  You might notice some irritation and congestion in your nose or some drainage.  This is from the oxygen used during your procedure.  There is no need for concern and it should clear up in a day or so.  SYMPTOMS TO REPORT IMMEDIATELY:  Following lower endoscopy (colonoscopy or flexible sigmoidoscopy):  Excessive amounts of blood in the stool  Significant tenderness or worsening of abdominal pains  Swelling of the abdomen that is new, acute  Fever of 100F or higher  For urgent or emergent issues, a gastroenterologist can be reached at any hour by calling [phone]. Do not use MyChart messaging for urgent concerns.    DIET:  We do recommend a small meal at first, but then you may proceed to your regular diet.  Drink plenty of fluids but you should avoid alcoholic beverages for 24 hours.  ACTIVITY:  You should plan to take it easy for the  rest of today and you should NOT DRIVE or use heavy machinery until tomorrow (because of the sedation medicines used during the test).    FOLLOW UP: Our staff will call the number listed on your records 48-72 hours following your procedure to check on you and address any questions or concerns that you may have regarding the information given to you following your procedure. If we do not reach you, we will leave a message.  We will attempt to reach you two times.  During this call, we will ask if you have developed any symptoms of COVID 19. If you develop any symptoms (ie: fever, flu-like symptoms, shortness of breath, cough etc.) before then, please call [phone].  If you test positive for Covid 19 in the 2 weeks post procedure, please call and report this information to [place].    If any biopsies were taken you will be contacted by phone or by letter within the next 1-3 weeks.  Please call us at [phone] if you have not heard about the biopsies in 3 weeks.    SIGNATURES/CONFIDENTIALITY: You and/or your care partner have signed paperwork which will be entered into your electronic medical record.  These signatures attest to the fact that that the information above on your After Visit Summary has been reviewed and is understood.  Full responsibility of the confidentiality of this discharge information lies with you and/or your care-partner.

## 2021-11-06 NOTE — Telephone Encounter (Signed)
Pt is currently at the office for her procedure

## 2021-11-06 NOTE — Op Note (Signed)
[facility] Patient Name: Chelsea Bradshaw Procedure Date: 11/06/2021 9:11 AM MRN: [id number] Endoscopist: Remo Lipps P. Havery Moros , MD Age: 51 Referring MD:  Date of Birth: 26-May-1971 Gender: Female Account #: [id number] Procedure:                Colonoscopy Indications:              Iron deficiency anemia, positive fecal calprotectin                            - chronic nausea. EGD showed gastritis treated with                            PPI, symptoms persist. Baseline denies diarrhea,                            actually has constipation Medicines:                Monitored Anesthesia Care Procedure:                Pre-Anesthesia Assessment:                           - Prior to the procedure, a History and Physical                            was performed, and patient medications and                            allergies were reviewed. The patient's tolerance of                            previous anesthesia was also reviewed. The risks                            and benefits of the procedure and the sedation                            options and risks were discussed with the patient.                            All questions were answered, and informed consent                            was obtained. Prior Anticoagulants: The patient has                            taken no previous anticoagulant or antiplatelet                            agents. ASA Grade Assessment: II - A patient with                            mild systemic disease. After reviewing the risks  and benefits, the patient was deemed in                            satisfactory condition to undergo the procedure.                           After obtaining informed consent, the colonoscope                            was passed under direct vision. Throughout the                            procedure, the patient's blood pressure, pulse, and                            oxygen saturations were  monitored continuously. The                            Olympus PCF-H190DL [id number]) Colonoscope was                            introduced through the anus and advanced to the the                            terminal ileum, with identification of the                            appendiceal orifice and IC valve. The colonoscopy                            was performed without difficulty. The patient                            tolerated the procedure well. The quality of the                            bowel preparation was good. The terminal ileum,                            ileocecal valve, appendiceal orifice, and rectum                            were photographed. Scope In: 9:22:51 AM Scope Out: 9:44:56 AM Scope Withdrawal Time: 0 hours 18 minutes 37 seconds  Total Procedure Duration: 0 hours 22 minutes 5 seconds  Findings:                 The perianal and digital rectal examinations were                            normal.                           Mildly erythematous mucosa in the terminal ileum  without focal ulceration. Biopsies were taken with                            a cold forceps for histology.                           A few small-mouthed diverticula were found in the                            ascending colon.                           A diminutive polyp was found in the transverse                            colon. The polyp was sessile. The polyp was removed                            with a cold biopsy forceps. Resection and retrieval                            were complete.                           A 4 mm polyp was found in the recto-sigmoid colon.                            The polyp was sessile. The polyp was removed with a                            cold snare. Resection and retrieval were complete.                           The exam was otherwise without abnormality. No                            colonic inflammation. Complications:             No immediate complications. Estimated blood loss:                            Minimal. Estimated Blood Loss:     Estimated blood loss was minimal. Impression:               - Mildly erythematous mucosa in the terminal ileum.                            Biopsied.                           - Diverticulosis in the ascending colon.                           - One diminutive polyp in the transverse colon,  removed with a cold biopsy forceps. Resected and                            retrieved.                           - One 4 mm polyp at the recto-sigmoid colon,                            removed with a cold snare. Resected and retrieved.                           - The examination was otherwise normal. Recommendation:           - Patient has a contact number available for                            emergencies. The signs and symptoms of potential                            delayed complications were discussed with the                            patient. Return to normal activities tomorrow.                            Written discharge instructions were provided to the                            patient.                           - Resume previous diet.                           - Continue present medications.                           - Await pathology results with further                            recommendations.                           - Go to the lab for repeat CBC, CMET to make sure                            stable                           - CT scan abdomen / pelvis with contrast if                            symptoms persist, will discuss with the patient Carlota Raspberry. Maevyn Riordan, MD 11/06/2021 9:53:28 AM This report has been signed electronically.

## 2021-11-06 NOTE — Telephone Encounter (Signed)
Please see alternate patient message

## 2021-11-06 NOTE — Addendum Note (Signed)
Addended by: Yevette Edwards on: 11/06/2021 04:13 PM   Modules accepted: Orders

## 2021-11-06 NOTE — Progress Notes (Signed)
Called to room to assist during endoscopic procedure.  Patient ID and intended procedure confirmed with present staff. Received instructions for my participation in the procedure from the performing physician.  

## 2021-11-06 NOTE — Progress Notes (Signed)
Pt denies any discomfort, continuously  moan Dr Havery Moros aware, vss

## 2021-11-07 ENCOUNTER — Encounter: Payer: Self-pay | Admitting: Family Medicine

## 2021-11-07 ENCOUNTER — Encounter: Payer: Self-pay | Admitting: Gastroenterology

## 2021-11-08 ENCOUNTER — Encounter: Payer: Self-pay | Admitting: Gastroenterology

## 2021-11-08 ENCOUNTER — Telehealth: Payer: Self-pay | Admitting: Gastroenterology

## 2021-11-08 ENCOUNTER — Telehealth: Payer: Self-pay | Admitting: Pharmacy Technician

## 2021-11-08 DIAGNOSIS — R1084 Generalized abdominal pain: Secondary | ICD-10-CM

## 2021-11-08 DIAGNOSIS — R110 Nausea: Secondary | ICD-10-CM

## 2021-11-08 DIAGNOSIS — R140 Abdominal distension (gaseous): Secondary | ICD-10-CM

## 2021-11-08 NOTE — Telephone Encounter (Signed)
Dr. Havery Moros,  Auth Submission: DENIED Payer: [employer]BS Medication & CPT/J Code(s) submitted: Feraheme (ferumoxytol) [phone] Route of submission (phone, fax, portal): PORTAL Auth type: Buy/Bill Units/visits requested: 2 Denied due to patient has not failed and or tried step therapy.  Venofer Ferrlecit Infed  Venofer is the preferred medication. If agreeable,  please re-enter a new therapy plan for venofer.  Thanks Maudie Mercury

## 2021-11-08 NOTE — Telephone Encounter (Signed)
This patient called insisting on speaking with Dr. Havery Moros about her results.  She was informed that I would forward her message to his nurse.

## 2021-11-08 NOTE — Telephone Encounter (Signed)
I called the patient back - we spoke for several minutes. She is quite anxious about the pathology results - she has some nonspecific inflammation in her ileum, not definitively diagnostic for anything, there were no definitive changes of chronicity. She asks about Crohn's disease and we discussed this for some time. I am recommending a CT enterography study to further evaluate this, evaluate for small bowel Crohn's and rule out other pathology that could be causing her symptoms. She has some fear of drinking contrast, we discussed this, she will do her best with it and take antiemetics prior to ingestion.  We discussed her medication list. Recommend she take standing doses of Zofran every 6-8 hours if that helps her nausea and phenergan PRN. She should be taking her klonopin and Lexapro for anxiety. I spoke with Ledora Bottcher for several minutes about her case who will see her next week to readdress her anxiety / depression.   [place] can you please contact the patient Monday and order CT enterography study to be done ASAP, she is quite anxious about it. Thanks

## 2021-11-08 NOTE — Telephone Encounter (Signed)
[facility] called requesting to speak with a nurse said she is feeling her stomach burning and does not know if it has to do with the procedure.

## 2021-11-08 NOTE — Addendum Note (Signed)
Addended by: Yevette Edwards on: 11/08/2021 04:15 PM   Modules accepted: Orders

## 2021-11-08 NOTE — Telephone Encounter (Signed)
Orders for Chelsea Bradshaw has been D/C. Venofer therapy plan entered. Thanks

## 2021-11-09 ENCOUNTER — Encounter: Payer: Self-pay | Admitting: Gastroenterology

## 2021-11-09 ENCOUNTER — Telehealth: Payer: Self-pay | Admitting: Internal Medicine

## 2021-11-09 NOTE — Telephone Encounter (Signed)
Paged [phone] - called twice no answers  She called me back  Very nauseous - not a new issue Also light-headed  She has promethazine and ondansetron and can use those  Denies other sxs  Chart review shows severe anxiety issues - we discussed this may be cause of sxs  Advised urgent care visit if current tx does not help

## 2021-11-10 ENCOUNTER — Encounter: Payer: Self-pay | Admitting: Gastroenterology

## 2021-11-11 ENCOUNTER — Encounter: Payer: Self-pay | Admitting: Physical Medicine & Rehabilitation

## 2021-11-11 ENCOUNTER — Encounter: Payer: Self-pay | Admitting: Gastroenterology

## 2021-11-11 NOTE — Telephone Encounter (Signed)
Noted, thanks!

## 2021-11-11 NOTE — Telephone Encounter (Signed)
Per pt's chart, Dr. Havery Moros had an extensive conversation with this pt per the note charted at 6:04pm on 11/08/21. This call was routed prior to that conversation. Dr. Havery Moros is aware of pt's complaints and he plans for further testing to be scheduled today by his nurse.

## 2021-11-11 NOTE — Telephone Encounter (Signed)
[place],  [place]: Venofer The patient will be scheduled as soon as possbile. Ty Maudie Mercury

## 2021-11-11 NOTE — Telephone Encounter (Signed)
CT enterography order in epic. Secure staff message sent to radiology scheduling to contact pt ASAP to set up her CT appt.

## 2021-11-12 ENCOUNTER — Encounter: Payer: BC Managed Care – PPO | Admitting: Gastroenterology

## 2021-11-12 ENCOUNTER — Encounter: Payer: Self-pay | Admitting: Gastroenterology

## 2021-11-12 NOTE — Telephone Encounter (Signed)
Thanks Glendell Docker. I received multiple mychart messages as well and responded to her. Thanks

## 2021-11-12 NOTE — Telephone Encounter (Signed)
Patient has been scheduled for CT enterography at [facility] on Friday, 11/15/21 at 9 am. Pt will need to arrive at [facility] CT at 7:45 am. She will drink contrast when she arrives. NPO 4 hours prior. Pt has been notified of appt via my chart.

## 2021-11-13 ENCOUNTER — Encounter: Payer: Self-pay | Admitting: Gastroenterology

## 2021-11-13 ENCOUNTER — Telehealth: Payer: Self-pay | Admitting: Gastroenterology

## 2021-11-13 ENCOUNTER — Other Ambulatory Visit: Payer: Self-pay

## 2021-11-13 ENCOUNTER — Ambulatory Visit (INDEPENDENT_AMBULATORY_CARE_PROVIDER_SITE_OTHER): Payer: BC Managed Care – PPO

## 2021-11-13 VITALS — BP 109/75 | HR 75 | Temp 98.1°F | Resp 16 | Ht 63.0 in | Wt 115.4 lb

## 2021-11-13 DIAGNOSIS — D508 Other iron deficiency anemias: Secondary | ICD-10-CM

## 2021-11-13 MED ORDER — SODIUM CHLORIDE 0.9 % IV SOLN: Freq: Once | INTRAVENOUS | Status: DC | PRN

## 2021-11-13 MED ORDER — SODIUM CHLORIDE 0.9 % IV SOLN
200.0000 mg | Freq: Once | INTRAVENOUS | Status: AC
  Administered 2021-11-13: 200 mg via INTRAVENOUS
  Filled 2021-11-13: qty 10

## 2021-11-13 MED ORDER — METHYLPREDNISOLONE SODIUM SUCC 125 MG IJ SOLR: 125.0000 mg | Freq: Once | INTRAMUSCULAR | Status: DC | PRN

## 2021-11-13 MED ORDER — DIPHENHYDRAMINE HCL 50 MG/ML IJ SOLN: 50.0000 mg | Freq: Once | INTRAMUSCULAR | Status: DC | PRN

## 2021-11-13 MED ORDER — EPINEPHRINE 0.3 MG/0.3ML IJ SOAJ: 0.3000 mg | Freq: Once | INTRAMUSCULAR | Status: DC | PRN

## 2021-11-13 MED ORDER — ALBUTEROL SULFATE HFA 108 (90 BASE) MCG/ACT IN AERS: 2.0000 | INHALATION_SPRAY | Freq: Once | RESPIRATORY_TRACT | Status: DC | PRN

## 2021-11-13 MED ORDER — FAMOTIDINE IN NACL 20-0.9 MG/50ML-% IV SOLN: 20.0000 mg | Freq: Once | INTRAVENOUS | Status: DC | PRN

## 2021-11-13 NOTE — Telephone Encounter (Signed)
The patient has sent several more MyChart messages since I've last spoken with her, including calling the office over the weekend.  I went through her course again with her, tried to reassure her, she is quite anxious about things.  She is going to have a CT scan done on Friday and we will await that result, once I have that back she will be contacted with further recommendations.  I asked her about her messages in regards to she states sometimes she takes her medications and sometimes she does not.  I have asked her to take standing doses of her antiemetics whether it be Zofran or Phenergan a few times daily to prevent nausea and she has not done so on a consistent basis.  I realize she does not want to take medications but if she is feeling poorly she should take the medication regimen as I have outlined for her numerous times.  She understands and states she will try to do so.  She had her IV iron infusion.  She has been referred to acupuncture.  We will wait for her CT scan later this week.  She is seeing her primary care tomorrow for reassessment of her anxiety which is playing a critical role in her symptoms right now, she is not able to cope very well and she will be working on this with Ledora Bottcher.  I provided additional reassurance and will contact her in the upcoming few days when I have the CT scan results.  She agreed

## 2021-11-13 NOTE — Progress Notes (Signed)
Diagnosis: Iron Deficiency Anemia  Provider:  Marshell Garfinkel, MD  Procedure: Infusion  IV Type: Peripheral, IV Location: L Forearm  Venofer (Iron Sucrose), Dose: 200 mg  Infusion Start Time: 13.19 11/13/2021  Infusion Stop Time: 13.40 11/13/2021  Post Infusion IV Care: 30 minutes Observation period completed and Peripheral IV Discontinued  Discharge: Condition: Good, Destination: Home . AVS provided to patient.   Performed by:  Arnoldo Morale, RN

## 2021-11-14 ENCOUNTER — Encounter: Payer: Self-pay | Admitting: Gastroenterology

## 2021-11-15 ENCOUNTER — Other Ambulatory Visit: Payer: Self-pay | Admitting: Gastroenterology

## 2021-11-15 ENCOUNTER — Other Ambulatory Visit: Payer: Self-pay

## 2021-11-15 ENCOUNTER — Ambulatory Visit: Payer: BC Managed Care – PPO

## 2021-11-15 ENCOUNTER — Encounter: Payer: Self-pay | Admitting: Gastroenterology

## 2021-11-15 ENCOUNTER — Ambulatory Visit (INDEPENDENT_AMBULATORY_CARE_PROVIDER_SITE_OTHER)
Admission: RE | Admit: 2021-11-15 | Discharge: 2021-11-15 | Disposition: A | Discharge status: Home / Self Care | Payer: BC Managed Care – PPO | Source: Ambulatory Visit | Attending: Gastroenterology | Admitting: Gastroenterology

## 2021-11-15 DIAGNOSIS — R140 Abdominal distension (gaseous): Secondary | ICD-10-CM

## 2021-11-15 DIAGNOSIS — R110 Nausea: Secondary | ICD-10-CM | POA: Diagnosis not present

## 2021-11-15 DIAGNOSIS — R1084 Generalized abdominal pain: Secondary | ICD-10-CM

## 2021-11-15 DIAGNOSIS — K5000 Crohn's disease of small intestine without complications: Secondary | ICD-10-CM

## 2021-11-15 MED ORDER — BUDESONIDE 3 MG PO CPEP: 9.0000 mg | ORAL_CAPSULE | Freq: Every day | ORAL | 0 refills | Status: DC

## 2021-11-15 MED ORDER — IOHEXOL 300 MG/ML  SOLN
80.0000 mL | Freq: Once | INTRAMUSCULAR | Status: AC | PRN
  Administered 2021-11-15: 80 mL via INTRAVENOUS

## 2021-11-16 ENCOUNTER — Encounter: Payer: Self-pay | Admitting: Gastroenterology

## 2021-11-17 ENCOUNTER — Encounter: Payer: Self-pay | Admitting: Gastroenterology

## 2021-11-18 ENCOUNTER — Ambulatory Visit: Payer: BC Managed Care – PPO

## 2021-11-18 ENCOUNTER — Other Ambulatory Visit: Payer: Self-pay

## 2021-11-18 ENCOUNTER — Telehealth: Payer: Self-pay | Admitting: Gastroenterology

## 2021-11-18 ENCOUNTER — Ambulatory Visit (INDEPENDENT_AMBULATORY_CARE_PROVIDER_SITE_OTHER): Payer: BC Managed Care – PPO

## 2021-11-18 ENCOUNTER — Encounter: Payer: Self-pay | Admitting: Gastroenterology

## 2021-11-18 VITALS — BP 103/70 | HR 66 | Temp 97.9°F | Resp 16 | Ht 63.0 in | Wt 117.4 lb

## 2021-11-18 DIAGNOSIS — D508 Other iron deficiency anemias: Secondary | ICD-10-CM

## 2021-11-18 MED ORDER — ALBUTEROL SULFATE HFA 108 (90 BASE) MCG/ACT IN AERS: 2.0000 | INHALATION_SPRAY | Freq: Once | RESPIRATORY_TRACT | Status: DC | PRN

## 2021-11-18 MED ORDER — EPINEPHRINE 0.3 MG/0.3ML IJ SOAJ: 0.3000 mg | Freq: Once | INTRAMUSCULAR | Status: DC | PRN

## 2021-11-18 MED ORDER — SODIUM CHLORIDE 0.9 % IV SOLN
200.0000 mg | Freq: Once | INTRAVENOUS | Status: DC
  Filled 2021-11-18: qty 10

## 2021-11-18 MED ORDER — DIPHENHYDRAMINE HCL 50 MG/ML IJ SOLN: 50.0000 mg | Freq: Once | INTRAMUSCULAR | Status: DC | PRN

## 2021-11-18 MED ORDER — SODIUM CHLORIDE 0.9 % IV SOLN
200.0000 mg | Freq: Once | INTRAVENOUS | Status: AC
  Administered 2021-11-18: 200 mg via INTRAVENOUS
  Filled 2021-11-18: qty 10

## 2021-11-18 MED ORDER — FAMOTIDINE IN NACL 20-0.9 MG/50ML-% IV SOLN: 20.0000 mg | Freq: Once | INTRAVENOUS | Status: DC | PRN

## 2021-11-18 MED ORDER — SODIUM CHLORIDE 0.9 % IV SOLN: Freq: Once | INTRAVENOUS | Status: DC | PRN

## 2021-11-18 MED ORDER — METHYLPREDNISOLONE SODIUM SUCC 125 MG IJ SOLR: 125.0000 mg | Freq: Once | INTRAMUSCULAR | Status: DC | PRN

## 2021-11-18 NOTE — Telephone Encounter (Signed)
Patient has left numerous MyChart messages over the weekend and today.  I called her back to discuss further and she did not answer, I left a message.  I will try calling her back again later.

## 2021-11-18 NOTE — Telephone Encounter (Signed)
Patient called regarding recent lab results states she would like to discuss it with someone.

## 2021-11-18 NOTE — Telephone Encounter (Signed)
Patient called stating she was supposed to see or talk to Dr. Havery Moros this week?  The first open appointment I have with him isn't until March.  Please call patient and advise.  Thank you.

## 2021-11-18 NOTE — Telephone Encounter (Signed)
Called patient back and she states she is worried about her symptoms. See chart notes from [facility]. I scheduled an office visit with Dr.Armbruster on 11/21/21

## 2021-11-18 NOTE — Progress Notes (Signed)
Diagnosis: Iron Deficiency Anemia  Provider:  Marshell Garfinkel, MD  Procedure: Infusion  IV Type: Peripheral, IV Location: R Forearm  Venofer (Iron Sucrose), Dose: 200 mg  Infusion Start Time: 8309  Infusion Stop Time: 4076  Post Infusion IV Care: Peripheral IV Discontinued  Discharge: Condition: Good, Destination: Home . AVS provided to patient.   Performed by:  Koren Shiver, RN

## 2021-11-18 NOTE — Telephone Encounter (Signed)
I called the patient back again and got a hold of her.  We discussed her issues.  She is incredibly anxious and having a very hard time coping with this at present time.  She states the Klonopin helps her more than anything but has not been taking it routinely.  She saw Ledora Bottcher this morning and was put on a scheduled Klonopin dosing to help relieve her anxiety.  She just started taking budesonide but is taking it.  I counseled her this should not cause ulcers in her stomach.  I hope it will help relieve her bowel inflammation and start making her feel better.  She is scheduled to see me in a few days to discuss this further.  I otherwise asked her to minimize the amount of MyChart messages when she communicates with [place].  She sent numerous messages over the weekend which our staff nor myself are able to see.  I asked that she consolidate all of her issues into 1 message at a time if she needs to reach out to [place].

## 2021-11-19 ENCOUNTER — Encounter: Payer: Self-pay | Admitting: Gastroenterology

## 2021-11-20 ENCOUNTER — Other Ambulatory Visit: Payer: Self-pay

## 2021-11-20 ENCOUNTER — Ambulatory Visit (INDEPENDENT_AMBULATORY_CARE_PROVIDER_SITE_OTHER): Payer: BC Managed Care – PPO

## 2021-11-20 VITALS — BP 110/75 | HR 72 | Temp 98.4°F | Resp 18 | Ht 63.0 in | Wt 117.6 lb

## 2021-11-20 DIAGNOSIS — D508 Other iron deficiency anemias: Secondary | ICD-10-CM

## 2021-11-20 MED ORDER — METHYLPREDNISOLONE SODIUM SUCC 125 MG IJ SOLR: 125.0000 mg | Freq: Once | INTRAMUSCULAR | Status: DC | PRN

## 2021-11-20 MED ORDER — SODIUM CHLORIDE 0.9 % IV SOLN
200.0000 mg | Freq: Once | INTRAVENOUS | Status: AC
  Administered 2021-11-20: 14:00:00 200 mg via INTRAVENOUS
  Filled 2021-11-20: qty 10

## 2021-11-20 MED ORDER — DIPHENHYDRAMINE HCL 50 MG/ML IJ SOLN: 50.0000 mg | Freq: Once | INTRAMUSCULAR | Status: DC | PRN

## 2021-11-20 MED ORDER — ALBUTEROL SULFATE HFA 108 (90 BASE) MCG/ACT IN AERS: 2.0000 | INHALATION_SPRAY | Freq: Once | RESPIRATORY_TRACT | Status: DC | PRN

## 2021-11-20 MED ORDER — EPINEPHRINE 0.3 MG/0.3ML IJ SOAJ: 0.3000 mg | Freq: Once | INTRAMUSCULAR | Status: DC | PRN

## 2021-11-20 MED ORDER — FAMOTIDINE IN NACL 20-0.9 MG/50ML-% IV SOLN: 20.0000 mg | Freq: Once | INTRAVENOUS | Status: DC | PRN

## 2021-11-20 MED ORDER — SODIUM CHLORIDE 0.9 % IV SOLN: Freq: Once | INTRAVENOUS | Status: DC | PRN

## 2021-11-20 NOTE — Patient Instructions (Signed)
98%

## 2021-11-20 NOTE — Progress Notes (Signed)
Diagnosis: Iron Deficiency Anemia  Provider:  Marshell Garfinkel, MD  Procedure: Infusion  IV Type: Peripheral, IV Location: L Hand  Venofer (Iron Sucrose), Dose: 200 mg  Infusion Start Time: 6190  Infusion Stop Time: 1222  Post Infusion IV Care: Peripheral IV Discontinued  Discharge: Condition: Good, Destination: Home . AVS provided to patient.   Performed by:  Cleophus Molt, RN

## 2021-11-21 ENCOUNTER — Encounter: Payer: Self-pay | Admitting: Gastroenterology

## 2021-11-21 ENCOUNTER — Ambulatory Visit (INDEPENDENT_AMBULATORY_CARE_PROVIDER_SITE_OTHER): Payer: BC Managed Care – PPO | Admitting: Gastroenterology

## 2021-11-21 ENCOUNTER — Other Ambulatory Visit: Payer: BC Managed Care – PPO

## 2021-11-21 ENCOUNTER — Other Ambulatory Visit (INDEPENDENT_AMBULATORY_CARE_PROVIDER_SITE_OTHER): Payer: BC Managed Care – PPO

## 2021-11-21 VITALS — BP 100/60 | HR 83 | Ht 63.0 in | Wt 116.0 lb

## 2021-11-21 DIAGNOSIS — F411 Generalized anxiety disorder: Secondary | ICD-10-CM

## 2021-11-21 DIAGNOSIS — R110 Nausea: Secondary | ICD-10-CM

## 2021-11-21 DIAGNOSIS — R140 Abdominal distension (gaseous): Secondary | ICD-10-CM

## 2021-11-21 DIAGNOSIS — K529 Noninfective gastroenteritis and colitis, unspecified: Secondary | ICD-10-CM

## 2021-11-21 LAB — C-REACTIVE PROTEIN: CRP: <1 mg/dL (ref 0.5–20.0)

## 2021-11-21 LAB — SEDIMENTATION RATE: Sed Rate: 22 mm/hr (ref 0–30)

## 2021-11-21 MED ORDER — PANTOPRAZOLE SODIUM 40 MG PO TBEC: 40.0000 mg | DELAYED_RELEASE_TABLET | Freq: Every day | ORAL | 2 refills | Status: DC

## 2021-11-21 MED ORDER — POLYETHYLENE GLYCOL 3350 17 G PO PACK: 17.0000 g | PACK | Freq: Every day | ORAL | 0 refills | Status: DC | PRN

## 2021-11-21 NOTE — Patient Instructions (Addendum)
If you are age 51 or older, your body mass index should be between 23-30. Your Body mass index is 20.55 kg/m. If this is out of the aforementioned range listed, please consider follow up with your Primary Care Provider.  If you are age 100 or younger, your body mass index should be between 19-25. Your Body mass index is 20.55 kg/m. If this is out of the aformentioned range listed, please consider follow up with your Primary Care Provider.   ________________________________________________________  The [place] [facility] providers would like to encourage you to use [facility] to communicate with providers for non-urgent requests or questions.  Due to long hold times on the telephone, sending your provider a message by [facility] may be a faster and more efficient way to get a response.  Please allow 48 business hours for a response.  Please remember that this is for non-urgent requests.  _______________________________________________________  Please go to the lab in the basement of our building to have lab work done as you leave today. Hit "B" for basement when you get on the elevator.  When the doors open the lab is on your left.  We will call you with the results. Thank you.  Take Protonix 40 mg once a day.  Stop Zofran and take Phenergan as needed..  Start Miralax - once daily as needed.  Thank you for entrusting me with your care and for choosing [facility], Dr. [place] Cellar

## 2021-11-21 NOTE — Progress Notes (Signed)
HPI :  51 year old female here for a follow-up visit, accompanied by her husband today.  She is here to follow-up symptoms of nausea, chronic bloating, and recent colonoscopy and CT scan abnormalities. Please see her intake visit from 02/24/19 for full details of her case. Recall she has had issues with nausea / abdominal bloating, decreased appetite, in the setting of severe R ear pain and severe anxiety, for which she has had an extensive evaluation in multiple locations over recent years. She has had episodes of these symptoms in the past when living in [place] and [place], workup as outlined below.   Recall that in recent months she has been feeling recurrent nausea, dyspepsia, bloating.  She was noted to have a mild iron deficiency associated with this.  She had an EGD November 18, she had some mild gastritis, biopsies negative for H. pylori.  I tested her for celiac disease with duodenal biopsies that were also negative.  Placed her on a trial of high-dose Protonix 40 mg twice daily as well as Carafate as needed.  Symptoms had persisted on that regimen.  I tried her on some BuSpar 7.5 mg twice daily in the interim, he states it did not help too much although compliance with her medications has been difficult as she does not want to take much of anything.  I had been giving her Zofran and Phenergan as well for antiemetics, she states the Zofran has not been working too well but she has not taken it reliably.  Not taking much of the Phenergan either.  She has had significant anxiety in relation to her symptoms, she has been working with her primary care Windell Hummingbird whom I have discussed her case with multiple times up to this point.  She has been on Klonopin 3 times daily, Lexapro 10 mg daily, and recently Elavil was added to her regimen.  The patient has not been taking her Klonopin reliably, states she has been taking her Lexapro.  She never started the Elavil.  She endorses constipation bothering her  more recently.  Since her last visit with me she underwent a colonoscopy to complete her evaluation for IDA.  This was done on January 11.  Her colon was normal, small adenoma removed, she had some very mild erythema in her small intestine.  Biopsies were taken which showed some nonspecific inflammatory changes.  Patient denies any NSAID use.  She denies any diarrhea, in fact has been having more so constipation than anything.  We proceeded with a CT enterography study that was done on January 20.  This showed some fibrofatty infiltration of the distal ileum with some mucosal enhancement, can be seen with Crohn's disease.  At that point in time we had discussed her symptoms and these findings at length, was concerned about possible underlying mild Crohn's disease and she was started on budesonide 9 mg daily which she has been on for 5 days now.  She continues to feel poorly.  Had a long visit with the patient and her husband today about her symptoms.  She states the nausea has been really bothersome for her over the past few weeks, more so than usual, this is her main complaint.  She was previously on Protonix but we had tried to wean her off it, she did not want to take it, seems to be that the nausea has gotten worse and she stopped that.  She does not think the budesonide is helping much yet.  She feels bloated in  her abdomen and is often belching.  She is not having any diarrhea, is more so constipated and asking what she can take for that.  He denies any abdominal pain at all.  No fever.  She is able to eat food and drink and keep things down, but states she just has no appetite and is anxious about eating.  Husband thinks she perhaps has lost about 20 pounds over the past 4 to 5 months or so.  Emotionally she has not been able to cope with this very well at all.  She is extremely anxious, endorses feeling depressed.  She is not having any fevers.  She is having family come in from [place] to help stay with  her while she deals with this.  Overall she states this is the worst episode of symptoms she has had since she has had these cyclical symptoms in recent years.  Husband is concerned about her restricted diet, we have been trying to add foods back as she tolerates them but she has not really done so.  She is anxious that reintroducing foods will make her feel even worse than she already is.  Of note during the office visit today the patient was extremely anxious, pacing around the room, then sitting on the floor holding onto her husband, and frequently grasping my hands.    Prior workup: H  Breath test negative 02/07/19   She has had a colonoscopy in 2017 which was normal   US abdomen 03/09/19 - IMPRESSION: No acute sonographic abnormality detected. No specific abnormality detected to explain the patient's abdominal pain.   Prior records reviewed from other centers / countries:   3 upper endoscopies between Aug 2017 and Oct 2018 - reportedly showed H pylori gastritis, has been treated 3 times for H pylori with eradication noted on 2 tests in 2019   EGD 03/05/2017 - H pylori gastritis, small bowel biopsies normal   Esophageal manometry 11/12/2017 - normal 24 hour pH test done OFF PPI - normal   US kidneys 12/09/2017 - normal   H pylori stool antigen test 03/12/2018 - negative   H pylori breath test 05/28/2018 - negative   Fecal calprotectin 07/01/2018 - 62   RUQ [place] 07/2018 - tiny 64mm gallbladder polyp, otherwise normal   Other MRIs / CT scans on disk I cannot open/ see - she was told they were normal     EGD 09/13/21 -  - The exam of the esophagus was otherwise normal. - Patchy inflammation characterized by erosions was found in the gastric fundus, in the gastric body and in the gastric antrum. - The exam of the stomach was otherwise normal. - Biopsies were taken with a cold forceps in the gastric body, at the incisura and in the gastric antrum for Helicobacter pylori testing and  histology. - The duodenal bulb and second portion of the duodenum were normal. Biopsies for histology were taken with a cold forceps for evaluation of celiac disease.     1. Surgical [P], duodenal bulb and 2nd portion of duodenum - BENIGN DUODENAL MUCOSA - NO ACUTE INFLAMMATION, VILLOUS BLUNTING OR INCREASED INTRAEPITHELIAL LYMPHOCYTES IDENTIFIED 2. Surgical [P], gastric antrum and gastric body - MILD CHRONIC GASTRITIS WITH REACTIVE CHANGES - NO H. PYLORI OR INTESTINAL METAPLASIA IDENTIFIED - SEE COMMENT     Colonoscopy 11/06/21: The perianal and digital rectal examinations were normal. - Mildly erythematous mucosa in the terminal ileum without focal ulceration. Biopsies were taken with a cold forceps for histology. -  A few small-mouthed diverticula were found in the ascending colon. - A diminutive polyp was found in the transverse colon. The polyp was sessile. The polyp was removed with a cold biopsy forceps. Resection and retrieval were complete. - A 4 mm polyp was found in the recto-sigmoid colon. The polyp was sessile. The polyp was removed with a cold snare. Resection and retrieval were complete. - The exam was otherwise without abnormality. No colonic inflammation.  1. Surgical [P], colon, ileal biopsies - SUPERFICIAL FRAGMENTS OF ILEAL MUCOSA WITH MIXED ACUTE AND CHRONIC INFLAMMATION - NO GRANULOMAS, DYSPLASIA OR MALIGNANCY IDENTIFIED - SEE COMMENT 2. Surgical [P], colon, transverse, polyp (1) - SESSILE SERRATED POLYP (1 OF 2 FRAGMENTS) - BENIGN COLONIC MUCOSA (1 OF 2 FRAGMENTS) - NO HIGH-GRADE DYSPLASIA OR MALIGNANCY IDENTIFIED 3. Surgical [P], colon, recto-sigmoid, polyp (1) - TUBULAR ADENOMA (1 OF 1 FRAGMENTS) - NO HIGH-GRADE DYSPLASIA OR MALIGNANCY IDENTIFIED Microscopic Comment 1. Definitive features of chronicity are not identified. The differential diagnosis includes ischemia, NSAIDs and inflammatory bowel disease. Clinical correlation is recommended.   CT  enterography 11/15/2021: IMPRESSION: 1. Fibrofatty infiltration of the terminal/distal ileum with submucosal hyperenhancement. Findings can be seen in the setting of inflammatory bowel disease such as Crohn's disease. 2. No other areas of bowel inflammation identified    Past Medical History:  Diagnosis Date   Anxiety    COVID-19 long hauler manifesting chronic neurologic symptoms 05/2020   09-13-21--pt states burning pain of tongue since covid   Ear pain    right some times in the left    Gastritis    IBS (irritable bowel syndrome)    Vertigo 07/19/2019     Past Surgical History:  Procedure Laterality Date   CESAREAN SECTION     UPPER GASTROINTESTINAL ENDOSCOPY  09/13/2021   2019-[place], 2017 had 2   Family History  Problem Relation Age of Onset   Healthy Mother    Healthy Father    Colon cancer Neg Hx    Colon polyps Neg Hx    Esophageal cancer Neg Hx    Rectal cancer Neg Hx    Stomach cancer Neg Hx    Social History   Tobacco Use   Smoking status: Never   Smokeless tobacco: Never  Vaping Use   Vaping Use: Never used  Substance Use Topics   Alcohol use: Not Currently    Comment: wine occasionally couple times a year   Drug use: Never   Current Outpatient Medications  Medication Sig Dispense Refill   budesonide (ENTOCORT EC) 3 MG 24 hr capsule Take 3 capsules (9 mg total) by mouth daily. Take 3 capsules daily for 4 weeks, then 2 capsules daily for 2 weeks, then 1 capsule daily for 2 weeks, then stop 126 capsule 0   busPIRone (BUSPAR) 7.5 MG tablet Take 1 tablet (7.5 mg total) by mouth daily.     clonazePAM (KLONOPIN) 0.5 MG tablet Take 0.5 tablets (0.25 mg total) by mouth in the morning, at noon, and at bedtime. 30 tablet    escitalopram (LEXAPRO) 10 MG tablet Take 10 mg by mouth daily.     gabapentin (NEURONTIN) 100 MG capsule Take 2 capsules by mouth 2 (two) times daily.     ondansetron (ZOFRAN) 4 MG tablet Take 1 tablet (4 mg total) by mouth every 6  (six) hours as needed. 90 tablet 1   pantoprazole (PROTONIX) 20 MG tablet Take 1 tablet (20 mg total) by mouth daily as needed. 90 tablet 1   promethazine (  PHENERGAN) 12.5 MG tablet Take 12.5-25 mg by mouth every 6 (six) hours as needed.     sucralfate (CARAFATE) 1 g tablet Decrease Carafate to one tablet daily for several days, then use only as needed. 60 tablet 1   Vitamin D, Ergocalciferol, (DRISDOL) 1.25 MG (50000 UNIT) CAPS capsule Take 50,000 Units by mouth once a week.     No current facility-administered medications for this visit.   Facility-Administered Medications Ordered in Other Visits  Medication Dose Route Frequency Provider Last Rate Last Admin   0.9 %  sodium chloride infusion   Intravenous Once PRN Zynasia Burklow, Carlota Raspberry, MD       albuterol (VENTOLIN HFA) 108 (90 Base) MCG/ACT inhaler 2 puff  2 puff Inhalation Once PRN Gloriajean Okun, Carlota Raspberry, MD       diphenhydrAMINE (BENADRYL) injection 50 mg  50 mg Intravenous Once PRN Anina Schnake, Carlota Raspberry, MD       EPINEPHrine (EPI-PEN) injection 0.3 mg  0.3 mg Intramuscular Once PRN Sabirin Baray, Carlota Raspberry, MD       famotidine (PEPCID) IVPB 20 mg premix  20 mg Intravenous Once PRN Eavan Gonterman, Carlota Raspberry, MD       methylPREDNISolone sodium succinate (SOLU-MEDROL) 125 mg/2 mL injection 125 mg  125 mg Intravenous Once PRN Angelina Venard, Carlota Raspberry, MD       No Known Allergies   Review of Systems: All systems reviewed and negative except where noted in HPI.    CT ENTERO ABD/PELVIS W CONTAST  Result Date: 11/15/2021 CLINICAL DATA:  Bloating and decreased appetite since October, severe nausea, colonoscopy showed inflamed small bowel. Evaluate for small bowel Crohn's disease. EXAM: CT ABDOMEN AND PELVIS WITH CONTRAST (ENTEROGRAPHY) TECHNIQUE: Multidetector CT of the abdomen and pelvis during bolus administration of intravenous contrast. Negative oral contrast was given. RADIATION DOSE REDUCTION: This exam was performed according to the departmental  dose-optimization program which includes automated exposure control, adjustment of the mA and/or kV according to patient size and/or use of iterative reconstruction technique. CONTRAST:  32mL OMNIPAQUE IOHEXOL 300 MG/ML  SOLN COMPARISON:  Ultrasound Mar 09, 2019 FINDINGS: Lower chest:  No acute abnormality. Hepatobiliary: No suspicious hepatic lesion. Gallbladder is unremarkable. No biliary ductal dilation. Pancreas: No pancreatic ductal dilation or evidence of acute inflammation. Spleen: Within normal limits in size and appearance. Adrenals/Urinary Tract: Bilateral adrenal glands appear normal. No hydronephrosis. Kidneys demonstrate symmetric enhancement and excretion of contrast material. No solid enhancing renal mass. Urinary bladder is unremarkable for degree of distension. Stomach/Bowel: Stomach is distended without wall thickening. Normal position of the duodenum/ligament of Treitz. No pathologic dilation of small or large bowel. Fibrofatty infiltration of the terminal/distal ileum with submucosal hyperenhancement. No additional areas of small bowel inflammation identified. No suspicious colonic wall thickening or mass like lesions. No evidence of colonic inflammation. Vascular/Lymphatic: No pathologically enlarged lymph nodes. No evidence of abdominal aortic aneurysm Reproductive: No mass or other significant abnormality. Other: No significant abdominopelvic free fluid. No pneumoperitoneum. No walled off fluid collections. Musculoskeletal: L4 limbus vertebral body. No acute osseous abnormality. IMPRESSION: 1. Fibrofatty infiltration of the terminal/distal ileum with submucosal hyperenhancement. Findings can be seen in the setting of inflammatory bowel disease such as Crohn's disease. 2. No other areas of bowel inflammation identified. Electronically Signed   By: Dahlia Bailiff M.D.   On: 11/15/2021 09:22    Lab Results  Component Value Date   WBC 4.6 11/06/2021   HGB 11.8 (L) 11/06/2021   HCT 35.6 (L)  11/06/2021   MCV 89.8 11/06/2021  PLT 324.0 11/06/2021    Lab Results  Component Value Date   CREATININE 0.69 11/06/2021   BUN 8 11/06/2021   NA 140 11/06/2021   K 3.7 11/06/2021   CL 107 11/06/2021   CO2 23 11/06/2021    Lab Results  Component Value Date   ALT 13 11/06/2021   AST 12 11/06/2021   ALKPHOS 52 11/06/2021   BILITOT 0.5 11/06/2021      Physical Exam: BP 100/60    Pulse 83    Ht $R'5\' 3"'lE$  (1.6 m)    Wt 116 lb (52.6 kg)    BMI 20.55 kg/m  Constitutional: Pleasant,well-developed, female in no acute distress. HEENT: Normocephalic and atraumatic. Conjunctivae are normal. No scleral icterus. Neck supple.  Cardiovascular: Normal rate, regular rhythm.  Pulmonary/chest: Effort normal and breath sounds normal.  Abdominal: Soft, nondistended, nontender. There are no masses palpable.  Extremities: no edema Lymphadenopathy: No cervical adenopathy noted. Neurological: Alert and oriented to person place and time. Skin: Skin is warm and dry. No rashes noted. Psychiatric: Normal mood and affect. Behavior is normal.   ASSESSMENT AND PLAN: 51 year old female here for reassessment of the following:  Nausea Dyspepsia Bloating Ileitis Anxiety  Had a lengthy discussion with the patient and her husband today about her course and history, and recent findings.  Clearly she is feeling poorly and has not been doing well in recent months, the question is what is driving this.  She has what appears to be fairly mild ileitis on colonoscopy and also on her CT scan.  Her symptoms seem out of proportion to objective findings to date.  She has no abdominal pain at all but severe nausea/dyspepsia and subjective bloating which is quite bothersome to her.  She is not coping well with this at all, and I suspect anxiety / depression could be largely driving her symptoms at this point.  We discussed her ileitis and differential diagnosis.  It is possible that she may have ileal Crohn's disease  although cannot say that for certain at this time.  Infection would seem a bit less likely, she does not have any diarrhea.  That being said she is quite anxious about infection and we will send GI pathogen panel to ensure negative.  I will also screen her for tuberculosis.  We will add an ESR and CRP today to make sure okay.  If she does have Crohn's disease, again she has no complications from this, it seems mild, and would be unusual to cause the severity of symptoms she is experiencing.  I reassured her that we do not see any cancer or anything concerning like that.  My hope is that budesonide may help to treat the ileitis and she may get some benefit from this, but counseled her it can take several days/weeks to feel better.  We could consider systemic prednisone but I do not think she will tolerate this well from an emotional standpoint, also she is very worried about side effects from that.  I would not recommend proceeding with biologic therapy at this point time and give her more time on the budesonide.  Regarding her symptoms, and it sounds like she felt better on the Protonix 40 mg daily and will resume that.  She is not taking the Zofran, states it does not help, will stop it.  I encouraged her to use her Phenergan as needed for the nausea.  She will continue Lexapro for her anxiety.  She was recommended to start Elavil but has  not started it yet.  Given her constipation, I would prefer to use something else.  I think Remeron might be a great option for her to help her appetite and better treat her anxiety.  I called her PCP Windell Hummingbird to discuss her case today and she agrees that is a good option. We otherwise discussed the severity of her mental health issues and question if she warrant psychiatric admission and formal psychiatry consult, Judson Roch will discuss this with her at their visit later today.  Otherwise she can use MiraLAX for her constipation.  I will let her know results of her lab work and  stool study, and hopefully with changes in her regimen as above she will feel better soon.   Plan: - lab today - Quantiferon gold, ESR, CRP, GI pathogen panel - restart Protonix $RemoveBeforeDE'40mg'steIOxGcpUzfesy$  / day - stop zofran - use phenergan PRN - continue Lexapro - patient is declining Elavil as offered by her PCP - adding Remeron I think is reasonable at this time, combine with Lexapro to better treat anxiety and nausea. Should help appetite, hopefully that can be started in near future. - psychiatry consultation recommended  Jolly Mango, MD [facility]

## 2021-11-22 ENCOUNTER — Other Ambulatory Visit: Payer: Self-pay

## 2021-11-22 ENCOUNTER — Encounter: Payer: Self-pay | Admitting: Gastroenterology

## 2021-11-22 ENCOUNTER — Ambulatory Visit (INDEPENDENT_AMBULATORY_CARE_PROVIDER_SITE_OTHER): Payer: BC Managed Care – PPO

## 2021-11-22 VITALS — BP 100/71 | HR 78 | Temp 97.9°F | Resp 18 | Ht 63.0 in | Wt 117.0 lb

## 2021-11-22 DIAGNOSIS — D508 Other iron deficiency anemias: Secondary | ICD-10-CM

## 2021-11-22 MED ORDER — METHYLPREDNISOLONE SODIUM SUCC 125 MG IJ SOLR: 125.0000 mg | Freq: Once | INTRAMUSCULAR | Status: DC | PRN

## 2021-11-22 MED ORDER — FAMOTIDINE IN NACL 20-0.9 MG/50ML-% IV SOLN: 20.0000 mg | Freq: Once | INTRAVENOUS | Status: DC | PRN

## 2021-11-22 MED ORDER — SODIUM CHLORIDE 0.9 % IV SOLN
200.0000 mg | Freq: Once | INTRAVENOUS | Status: AC
  Administered 2021-11-22: 200 mg via INTRAVENOUS
  Filled 2021-11-22: qty 10

## 2021-11-22 MED ORDER — ALBUTEROL SULFATE HFA 108 (90 BASE) MCG/ACT IN AERS: 2.0000 | INHALATION_SPRAY | Freq: Once | RESPIRATORY_TRACT | Status: DC | PRN

## 2021-11-22 MED ORDER — EPINEPHRINE 0.3 MG/0.3ML IJ SOAJ: 0.3000 mg | Freq: Once | INTRAMUSCULAR | Status: DC | PRN

## 2021-11-22 MED ORDER — SODIUM CHLORIDE 0.9 % IV SOLN: Freq: Once | INTRAVENOUS | Status: DC | PRN

## 2021-11-22 MED ORDER — DIPHENHYDRAMINE HCL 50 MG/ML IJ SOLN: 50.0000 mg | Freq: Once | INTRAMUSCULAR | Status: DC | PRN

## 2021-11-22 NOTE — Progress Notes (Signed)
Diagnosis: Iron Deficiency Anemia  Provider:  Marshell Garfinkel, MD  Procedure: Infusion  IV Type: Peripheral, IV Location: L Hand  Venofer (Iron Sucrose), Dose: 200 mg  Infusion Start Time: 3578  11/22/2021  Infusion Stop Time: 13.33  11/22/2021  Post Infusion IV Care: Peripheral IV Discontinued  Discharge: Condition: Good, Destination: Home . AVS provided to patient.   Performed by:  Arnoldo Morale, RN

## 2021-11-24 ENCOUNTER — Encounter: Payer: Self-pay | Admitting: Gastroenterology

## 2021-11-24 LAB — GI PROFILE, STOOL, PCR

## 2021-11-25 ENCOUNTER — Ambulatory Visit (INDEPENDENT_AMBULATORY_CARE_PROVIDER_SITE_OTHER): Payer: BC Managed Care – PPO

## 2021-11-25 ENCOUNTER — Other Ambulatory Visit: Payer: Self-pay

## 2021-11-25 ENCOUNTER — Encounter: Payer: Self-pay | Admitting: Gastroenterology

## 2021-11-25 VITALS — BP 106/71 | HR 73 | Temp 98.7°F | Resp 16 | Ht 63.0 in | Wt 116.2 lb

## 2021-11-25 DIAGNOSIS — D508 Other iron deficiency anemias: Secondary | ICD-10-CM

## 2021-11-25 LAB — QUANTIFERON-TB GOLD PLUS
Mitogen-NIL: >10 IU/mL
NIL: 0.03 IU/mL
QuantiFERON-TB Gold Plus: NEGATIVE
TB1-NIL: 0.01 IU/mL
TB2-NIL: 0 IU/mL

## 2021-11-25 MED ORDER — EPINEPHRINE 0.3 MG/0.3ML IJ SOAJ: 0.3000 mg | Freq: Once | INTRAMUSCULAR | Status: DC | PRN

## 2021-11-25 MED ORDER — DIPHENHYDRAMINE HCL 50 MG/ML IJ SOLN: 50.0000 mg | Freq: Once | INTRAMUSCULAR | Status: DC | PRN

## 2021-11-25 MED ORDER — METHYLPREDNISOLONE SODIUM SUCC 125 MG IJ SOLR: 125.0000 mg | Freq: Once | INTRAMUSCULAR | Status: DC | PRN

## 2021-11-25 MED ORDER — FAMOTIDINE IN NACL 20-0.9 MG/50ML-% IV SOLN: 20.0000 mg | Freq: Once | INTRAVENOUS | Status: DC | PRN

## 2021-11-25 MED ORDER — SODIUM CHLORIDE 0.9 % IV SOLN
200.0000 mg | Freq: Once | INTRAVENOUS | Status: AC
  Administered 2021-11-25: 200 mg via INTRAVENOUS
  Filled 2021-11-25: qty 10

## 2021-11-25 MED ORDER — SODIUM CHLORIDE 0.9 % IV SOLN: Freq: Once | INTRAVENOUS | Status: DC | PRN

## 2021-11-25 MED ORDER — ALBUTEROL SULFATE HFA 108 (90 BASE) MCG/ACT IN AERS: 2.0000 | INHALATION_SPRAY | Freq: Once | RESPIRATORY_TRACT | Status: DC | PRN

## 2021-11-25 NOTE — Progress Notes (Signed)
Diagnosis: Iron Deficiency Anemia  Provider:  Marshell Garfinkel, MD  Procedure: Infusion  IV Type: Peripheral, IV Location: L Hand  Venofer (Iron Sucrose), Dose: 200 mg  Infusion Start Time: 13.17 11/25/2021  Infusion Stop Time: 13.33 11/25/2021  Post Infusion IV Care: Peripheral IV Discontinued  Discharge: Condition: Good, Destination: Home . AVS provided to patient.   Performed by:  Arnoldo Morale, RN

## 2021-11-26 ENCOUNTER — Encounter: Payer: Self-pay | Admitting: Gastroenterology

## 2021-11-26 ENCOUNTER — Encounter: Payer: Self-pay | Admitting: Obstetrics and Gynecology

## 2021-11-27 ENCOUNTER — Encounter: Payer: Self-pay | Admitting: Gastroenterology

## 2021-11-27 NOTE — Telephone Encounter (Signed)
Chelsea Bradshaw can you let the patient know to increase her protonix to 40mg  / day if she is having reflux.  With all the bloating she is having I wonder if she may benefit from a course of empiric Rifaximin.  Can you see if she would be willing to do Rifaximin 550mg  TID for 2 weeks, but not sure if insurance would cover it, can try to order to her pharmacy. Thanks

## 2021-11-27 NOTE — Telephone Encounter (Signed)
Pt notified of recommendations via mychart.

## 2021-11-28 ENCOUNTER — Encounter: Payer: Self-pay | Admitting: Gastroenterology

## 2021-11-28 NOTE — Telephone Encounter (Signed)
I called Mrs. Hibner about her issues today.  Essentially continued nausea and bloating, she is not in any pain.  No change in her symptoms since I've last seen her.  Given her persistent symptoms I do feel like we need to make a change in her medications.  I have previously spoken with Windell Hummingbird about adding Remeron to her regimen, I think that might really help her nausea and appetite, in addition to her anxiety.  She will talk with Judson Roch about doing that and I have reached out to her as well, she will manage her regimen for her anxiety/depression.  The other thought was to add rifaximin empirically to see if we can reduce some of her bloating, if it is not cost prohibitive.  I think we should only add 1 medication at a time to her regimen given her sensitivity, and hold off on that while we consider adding Remeron in the near future.  She should continue the rest of her medications as outlined.  She agreed with the plan, all questions answered

## 2021-11-28 NOTE — Telephone Encounter (Signed)
Noted, thanks!

## 2021-11-29 ENCOUNTER — Encounter: Payer: Self-pay | Admitting: Gastroenterology

## 2021-11-29 ENCOUNTER — Encounter: Payer: BC Managed Care – PPO | Admitting: Physical Medicine & Rehabilitation

## 2021-11-30 ENCOUNTER — Encounter: Payer: Self-pay | Admitting: Gastroenterology

## 2021-12-01 ENCOUNTER — Encounter: Payer: Self-pay | Admitting: Gastroenterology

## 2021-12-02 ENCOUNTER — Encounter: Payer: Self-pay | Admitting: Gastroenterology

## 2021-12-03 DIAGNOSIS — R194 Change in bowel habit: Secondary | ICD-10-CM | POA: Insufficient documentation

## 2021-12-03 DIAGNOSIS — K529 Noninfective gastroenteritis and colitis, unspecified: Secondary | ICD-10-CM | POA: Insufficient documentation

## 2021-12-03 DIAGNOSIS — K30 Functional dyspepsia: Secondary | ICD-10-CM | POA: Insufficient documentation

## 2021-12-04 ENCOUNTER — Encounter: Payer: Self-pay | Admitting: Gastroenterology

## 2021-12-05 ENCOUNTER — Other Ambulatory Visit: Payer: Self-pay | Admitting: Gastroenterology

## 2021-12-05 ENCOUNTER — Encounter: Payer: Self-pay | Admitting: Gastroenterology

## 2021-12-05 DIAGNOSIS — K5000 Crohn's disease of small intestine without complications: Secondary | ICD-10-CM

## 2021-12-05 NOTE — Telephone Encounter (Signed)
Called and spoke with Gerald Stabs at [facility]. He states that they only dispensed 84 capsules because insurance limited it to a 24-month supply. The pt has 42 capsules remaining for this prescription. Gerald Stabs did a test claim today and it went through. Pt will be able to pick up the remainder of the prescription for $150.00.   Patient notified via my chart.

## 2021-12-05 NOTE — Telephone Encounter (Signed)
Chelsea Bradshaw can you make sure the patient has enough budesonide to complete 8 weeks of therapy - she was suppossed to take 9mg  / day for 4 weeks, then 6mg  / day for 2 weeks, then 3mg  / day for 2 weeks. Thanks   The correct prescription was sent in. My chart message sent to patient to clarify how she should be taking the Budesonide taper.

## 2021-12-06 ENCOUNTER — Encounter: Payer: Self-pay | Admitting: Gastroenterology

## 2021-12-06 ENCOUNTER — Telehealth: Payer: Self-pay | Admitting: Gastroenterology

## 2021-12-06 NOTE — Telephone Encounter (Signed)
Returned call to patient. She has been scheduled for a f/u appt with Dr. Havery Moros on Thursday, 12/19/21 at 4 pm. Pt states that her husband is going to pick up the rest of her Budesonide prescription today. Pt reports some improvement in her symptoms. She wondered if she would have nausea forever and if she would have a normal appetite again. I told her that I would not focus to much on her appetite if she is eating. I told her that her symptoms are improving so that is a good thing. She wanted to know if she needed to increase her Remeron, I told her that Windell Hummingbird, PA-C prescribes that for her and she would need to ask her. I told her that Dr. Havery Moros will reassess her symptoms at the time of her appt. Pt verbalized understanding and had no concerns at the end of the call.

## 2021-12-06 NOTE — Telephone Encounter (Signed)
Inbound call from patient, stated that she would like an appointment with Dr. Havery Moros to " meet" him before she starts traveling.  I offered her March 16th and she said that she did not want to wait that long to meet him. Asked if there was anyway I could work her in for at least 15 minutes to meet him and I stated there was no way to work her in if there weren't any appointments. Patient is asking if he can do Virtual appointment with her. Please advise.

## 2021-12-07 ENCOUNTER — Encounter: Payer: Self-pay | Admitting: Gastroenterology

## 2021-12-09 ENCOUNTER — Telehealth: Payer: Self-pay | Admitting: Gastroenterology

## 2021-12-09 ENCOUNTER — Other Ambulatory Visit: Payer: Self-pay | Admitting: Gastroenterology

## 2021-12-09 ENCOUNTER — Encounter: Payer: Self-pay | Admitting: Gastroenterology

## 2021-12-09 NOTE — Telephone Encounter (Signed)
Patient called wanting to get seen sooner with Dr. Havery Moros because she is traveling on the 17th of Feb. Please call and advise.

## 2021-12-10 ENCOUNTER — Encounter: Payer: Self-pay | Admitting: Gastroenterology

## 2021-12-10 DIAGNOSIS — R194 Change in bowel habit: Secondary | ICD-10-CM

## 2021-12-10 DIAGNOSIS — R195 Other fecal abnormalities: Secondary | ICD-10-CM

## 2021-12-10 NOTE — Telephone Encounter (Signed)
Returned call to pt. I left her a detailed message letting her know that Dr. Havery Moros does not have any sooner appts at this time. I told her to call back if she wanted to reschedule her 2/23 appt.

## 2021-12-11 ENCOUNTER — Other Ambulatory Visit: Payer: BC Managed Care – PPO

## 2021-12-11 ENCOUNTER — Encounter: Payer: Self-pay | Admitting: Gastroenterology

## 2021-12-11 DIAGNOSIS — R195 Other fecal abnormalities: Secondary | ICD-10-CM

## 2021-12-11 DIAGNOSIS — R194 Change in bowel habit: Secondary | ICD-10-CM

## 2021-12-11 NOTE — Telephone Encounter (Signed)
Chelsea Bradshaw can you let her know that is okay if she wants to leave town and be with some friends / family for a few days. If she wants to submit a stool sample before she leaves, can you please order fecal calprotectin and she can go to the lab to submit it. If she wants to do a virtual visit on the 23rd while she is out of town that is fine with me. If she is back in town at that time, she can see me in person or virtual, whichever her preference. Thanks   Patient notified via my chart. Lab order in epic.

## 2021-12-11 NOTE — Telephone Encounter (Signed)
See alternate patient message. 

## 2021-12-12 ENCOUNTER — Encounter: Payer: Self-pay | Admitting: Obstetrics and Gynecology

## 2021-12-13 ENCOUNTER — Encounter: Payer: Self-pay | Admitting: Gastroenterology

## 2021-12-14 LAB — CALPROTECTIN, FECAL: Calprotectin, Fecal: 89 ug/g (ref 0–120)

## 2021-12-16 ENCOUNTER — Encounter: Payer: Self-pay | Admitting: Gastroenterology

## 2021-12-16 ENCOUNTER — Telehealth: Payer: Self-pay | Admitting: Gastroenterology

## 2021-12-16 NOTE — Telephone Encounter (Signed)
Patient called requesting to speak with you regarding lab results.

## 2021-12-17 NOTE — Telephone Encounter (Signed)
It went from 150s to 89 on the last check, so it is better.  I will be discussing this further in detail with her on Thursday. I had sent a message to Jan about her visit as she messaged about that, I think okay to do virtual as she is currently out of town, if you can help coordinate. Thanks

## 2021-12-17 NOTE — Telephone Encounter (Signed)
My chart message sent to patient with results and appt change from office visit to my chart video visit.

## 2021-12-18 ENCOUNTER — Encounter: Payer: Self-pay | Admitting: Gastroenterology

## 2021-12-19 ENCOUNTER — Encounter: Payer: Self-pay | Admitting: Gastroenterology

## 2021-12-19 ENCOUNTER — Telehealth (INDEPENDENT_AMBULATORY_CARE_PROVIDER_SITE_OTHER): Payer: BC Managed Care – PPO | Admitting: Gastroenterology

## 2021-12-19 ENCOUNTER — Encounter: Payer: Self-pay | Admitting: Obstetrics and Gynecology

## 2021-12-19 DIAGNOSIS — K529 Noninfective gastroenteritis and colitis, unspecified: Secondary | ICD-10-CM | POA: Diagnosis not present

## 2021-12-19 DIAGNOSIS — R110 Nausea: Secondary | ICD-10-CM

## 2021-12-19 DIAGNOSIS — R140 Abdominal distension (gaseous): Secondary | ICD-10-CM | POA: Diagnosis not present

## 2021-12-19 DIAGNOSIS — K589 Irritable bowel syndrome without diarrhea: Secondary | ICD-10-CM

## 2021-12-19 DIAGNOSIS — F411 Generalized anxiety disorder: Secondary | ICD-10-CM

## 2021-12-19 MED ORDER — RIFAXIMIN 550 MG PO TABS
550.0000 mg | ORAL_TABLET | Freq: Three times a day (TID) | ORAL | 0 refills | Status: AC
Start: 2021-12-19 — End: 2022-01-02

## 2021-12-19 NOTE — Patient Instructions (Addendum)
If you are age 51 or older, your body mass index should be between 23-30. Your There is no height or weight on file to calculate BMI. If this is out of the aforementioned range listed, please consider follow up with your Primary Care Provider.  If you are age 37 or younger, your body mass index should be between 19-25. Your There is no height or weight on file to calculate BMI. If this is out of the aformentioned range listed, please consider follow up with your Primary Care Provider.   ________________________________________________________  The [facility] providers would like to encourage you to use [facility] to communicate with providers for non-urgent requests or questions.  Due to long hold times on the telephone, sending your provider a message by [facility] may be a faster and more efficient way to get a response.  Please allow 48 business hours for a response.  Please remember that this is for non-urgent requests.  _______________________________________________________  Please go to the lab one day next week.  Our lab is located in the basement of our building, located at [address]. They are open Monday through Friday from 7:30 am to 5:00 pm. You do not need an appointment.  We have sent the following medications to your pharmacy for you to pick up at your convenience: Xifaxan 550 mg: Take 1 tablet Three times a day for 14 days (Note: We have sent this to a specialty pharmacy, [facility], to see if we can get it approved by your insurance. Please give them a few days to work on it).  Please taper your budesonide as discussed.  Continue Remeron, Lexapro and Protonix.  Thank you for entrusting me with your care and for choosing [facility], Dr. Carpenter Cellar

## 2021-12-19 NOTE — Progress Notes (Signed)
THIS ENCOUNTER IS A VIRTUAL VISIT USING VIDEO ENABLED CHAT THROUGH Epic. PATIENT WAS NOT SEEN IN THE OFFICE. PATIENT HAS CONSENTED TO VIRTUAL VISIT / TELEMEDICINE VISIT   Location of patient: home Location of provider: office Persons participating: myself, patient, husband Time spent on call:  25 minutes  HPI :  51 year old female here for a follow-up visit, accompanied by her husband this afternoon.  Please see prior notes for extensive history.  She was last seen in the office on January 24.  She has symptoms of persistent nausea, chronic bloating.  Recall she has had a relatively recent colonoscopy and CT scan enterography which showed some mild ileitis.  She has had a fecal calprotectin of the 150s and mild iron deficiency anemia.  She has had severe anxiety associated with this, her symptoms have been challenging to treat.  Recall she has had the symptoms in the past and an extensive evaluation in multiple locations internationally over recent years, in [place], and [place] as outlined below.  Her prior work-ups never yielded mild inflammation as we have found recently.  Most recently she has been on budesonide 9 mg daily for 4 weeks, just started tapering down to 6 mg/day.  Since her last visit we also started her on Remeron nightly which she has escalated dosing, on for the past 3 weeks.  For her anxiety she also takes Klonopin 3 times daily as well as Lexapro 10 mg/day.  She is also been on pantoprazole 40 mg daily as well as Phenergan as needed.  We have taken her off BuSpar and Carafate.  She has not tolerated oral iron pills and has had IV iron infusions a few times since have last seen her.  She obtained a fecal calprotectin since have last seen her and the value was 89.  Her ESR and CRP have historically been pretty normal.  Very generally she appears better to me than she did at the last visit.  She states her nausea has been a bit less compared to when I last saw her which is good.   Her main symptom that persistently bothers her is bloating in her abdomen which does not seem to really get better regardless of what she is taking.  She has restricted her diet pretty severely at one-point but no dietary measures have really provided any benefit with this.  I have been encouraging her to liberalize her diet.  She had lost some weight 20 pounds over 5 months, but husband states she has gained about 4 pounds since of last seen in.  She appears to be eating better in general.  She is not vomiting.  She has been seen by her primary care for possible UTI.  She is also having some low back pain occasionally that bothers her as well.  She states anxiety continues to be active and bother her.  She is currently visiting friends and taking all virtually from their house.  She is considering going to [place] to visit family.  No blood in the stools or new changes in her bowels.  Protonix 40 mg once daily appears to be controlling reflux symptoms.  Recall at Zofran has not really been working too well for her nausea so she stopped it.  She takes Phenergan as needed. She has been working with her primary care Windell Hummingbird whom I have discussed her case with multiple times up to this point.        Prior workup: H  Breath test negative 02/07/19  She has had a colonoscopy in 2017 which was normal   US abdomen 03/09/19 - IMPRESSION: No acute sonographic abnormality detected. No specific abnormality detected to explain the patient's abdominal pain.   Prior records reviewed from other centers / countries:   3 upper endoscopies between Aug 2017 and Oct 2018 - reportedly showed H pylori gastritis, has been treated 3 times for H pylori with eradication noted on 2 tests in 2019   EGD 03/05/2017 - H pylori gastritis, small bowel biopsies normal   Esophageal manometry 11/12/2017 - normal 24 hour pH test done OFF PPI - normal   US kidneys 12/09/2017 - normal   H pylori stool antigen test 03/12/2018 -  negative   H pylori breath test 05/28/2018 - negative   Fecal calprotectin 07/01/2018 - 62   RUQ [place] 07/2018 - tiny 87mm gallbladder polyp, otherwise normal   Other MRIs / CT scans on disk I cannot open/ see - she was told they were normal     EGD 09/13/21 -  - The exam of the esophagus was otherwise normal. - Patchy inflammation characterized by erosions was found in the gastric fundus, in the gastric body and in the gastric antrum. - The exam of the stomach was otherwise normal. - Biopsies were taken with a cold forceps in the gastric body, at the incisura and in the gastric antrum for Helicobacter pylori testing and histology. - The duodenal bulb and second portion of the duodenum were normal. Biopsies for histology were taken with a cold forceps for evaluation of celiac disease.     1. Surgical [P], duodenal bulb and 2nd portion of duodenum - BENIGN DUODENAL MUCOSA - NO ACUTE INFLAMMATION, VILLOUS BLUNTING OR INCREASED INTRAEPITHELIAL LYMPHOCYTES IDENTIFIED 2. Surgical [P], gastric antrum and gastric body - MILD CHRONIC GASTRITIS WITH REACTIVE CHANGES - NO H. PYLORI OR INTESTINAL METAPLASIA IDENTIFIED - SEE COMMENT     Colonoscopy 11/06/21: The perianal and digital rectal examinations were normal. - Mildly erythematous mucosa in the terminal ileum without focal ulceration. Biopsies were taken with a cold forceps for histology. - A few small-mouthed diverticula were found in the ascending colon. - A diminutive polyp was found in the transverse colon. The polyp was sessile. The polyp was removed with a cold biopsy forceps. Resection and retrieval were complete. - A 4 mm polyp was found in the recto-sigmoid colon. The polyp was sessile. The polyp was removed with a cold snare. Resection and retrieval were complete. - The exam was otherwise without abnormality. No colonic inflammation.   1. Surgical [P], colon, ileal biopsies - SUPERFICIAL FRAGMENTS OF ILEAL MUCOSA WITH MIXED  ACUTE AND CHRONIC INFLAMMATION - NO GRANULOMAS, DYSPLASIA OR MALIGNANCY IDENTIFIED - SEE COMMENT 2. Surgical [P], colon, transverse, polyp (1) - SESSILE SERRATED POLYP (1 OF 2 FRAGMENTS) - BENIGN COLONIC MUCOSA (1 OF 2 FRAGMENTS) - NO HIGH-GRADE DYSPLASIA OR MALIGNANCY IDENTIFIED 3. Surgical [P], colon, recto-sigmoid, polyp (1) - TUBULAR ADENOMA (1 OF 1 FRAGMENTS) - NO HIGH-GRADE DYSPLASIA OR MALIGNANCY IDENTIFIED Microscopic Comment 1. Definitive features of chronicity are not identified. The differential diagnosis includes ischemia, NSAIDs and inflammatory bowel disease. Clinical correlation is recommended.     CT enterography 11/15/2021: IMPRESSION: 1. Fibrofatty infiltration of the terminal/distal ileum with submucosal hyperenhancement. Findings can be seen in the setting of inflammatory bowel disease such as Crohn's disease. 2. 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 05/2020   09-13-21--pt states burning pain of tongue since covid   Ear pain    right some times in the left    Gastritis    IBS (irritable bowel syndrome)    Ileitis    Vertigo 07/19/2019     Past Surgical History:  Procedure Laterality Date   CESAREAN SECTION     UPPER GASTROINTESTINAL ENDOSCOPY  09/13/2021   2019-[place], 2017 had 2   Family History  Problem Relation Age of Onset   Healthy Mother    Healthy Father    Colon cancer Neg Hx    Colon polyps Neg Hx    Esophageal cancer Neg Hx    Rectal cancer Neg Hx    Stomach cancer Neg Hx    Social History   Tobacco Use   Smoking status: Never   Smokeless tobacco: Never  Vaping Use   Vaping Use: Never used  Substance Use Topics   Alcohol use: Not Currently    Comment: wine occasionally couple times a year   Drug use: Never   Current Outpatient  Medications  Medication Sig Dispense Refill   rifaximin (XIFAXAN) 550 MG TABS tablet Take 1 tablet (550 mg total) by mouth 3 (three) times daily for 14 days. 42 tablet 0   budesonide (ENTOCORT EC) 3 MG 24 hr capsule Take 3 capsules (9 mg total) by mouth daily. Take 3 capsules daily for 4 weeks, then 2 capsules daily for 2 weeks, then 1 capsule daily for 2 weeks, then stop 126 capsule 0   clonazePAM (KLONOPIN) 0.5 MG tablet Take 0.5 tablets (0.25 mg total) by mouth in the morning, at noon, and at bedtime. 30 tablet    escitalopram (LEXAPRO) 10 MG tablet Take 10 mg by mouth daily.     gabapentin (NEURONTIN) 100 MG capsule Take 2 capsules by mouth 2 (two) times daily.     pantoprazole (PROTONIX) 40 MG tablet Take 1 tablet (40 mg total) by mouth daily. 90 tablet 1   polyethylene glycol (MIRALAX) 17 g packet Take 17 g by mouth daily as needed. 14 each 0   promethazine (PHENERGAN) 12.5 MG tablet Take 12.5-25 mg by mouth every 6 (six) hours as needed.     Vitamin D, Ergocalciferol, (DRISDOL) 1.25 MG (50000 UNIT) CAPS capsule Take 50,000 Units by mouth once a week.     No current facility-administered medications for this visit.   Facility-Administered Medications Ordered in Other Visits  Medication Dose Route Frequency Provider Last Rate Last Admin   albuterol (VENTOLIN HFA) 108 (90 Base) MCG/ACT inhaler 2 puff  2 puff Inhalation Once PRN Niyati Heinke, Carlota Raspberry, MD       diphenhydrAMINE (BENADRYL) injection 50 mg  50 mg Intravenous Once PRN Demitrios Molyneux, Carlota Raspberry, MD       EPINEPHrine (EPI-PEN) injection 0.3 mg  0.3 mg Intramuscular Once PRN Rajinder Mesick, Carlota Raspberry, MD       famotidine (PEPCID) IVPB 20 mg premix  20 mg Intravenous Once PRN Raffaele Derise, Carlota Raspberry, MD       methylPREDNISolone sodium succinate (SOLU-MEDROL) 125 mg/2 mL injection 125 mg  125 mg Intravenous Once PRN Raylan Hanton, Carlota Raspberry, MD       No Known Allergies   Review of Systems: All systems reviewed and negative except where noted in  HPI.   Lab Results  Component Value Date   WBC 4.6 11/06/2021   HGB 11.8 (  L) 11/06/2021   HCT 35.6 (L) 11/06/2021   MCV 89.8 11/06/2021   PLT 324.0 11/06/2021    Lab Results  Component Value Date   IRON 68 11/06/2021   TIBC 343.0 11/06/2021   FERRITIN 7.8 (L) 11/06/2021    Lab Results  Component Value Date   CREATININE 0.69 11/06/2021   BUN 8 11/06/2021   NA 140 11/06/2021   K 3.7 11/06/2021   CL 107 11/06/2021   CO2 23 11/06/2021    Lab Results  Component Value Date   ALT 13 11/06/2021   AST 12 11/06/2021   ALKPHOS 52 11/06/2021   BILITOT 0.5 11/06/2021    Lab Results  Component Value Date   ESRSEDRATE 22 11/21/2021    Lab Results  Component Value Date   CRP <1.0 11/21/2021     Physical Exam: There were no vitals taken for this visit. Constitutional: Pleasant,well-developed, female in no acute distress.   ASSESSMENT AND PLAN: 51 year old female here for reassessment following:  Ileitis Bloating IBS /functional bowel disorder Nausea Anxiety  As above, significant bloating and nausea as main symptoms bothering her in recent months, although she has had intermittent symptoms of this extending over the years with an extensive evaluation in the past that was unrevealing.  More recently she has been very mild ileitis on colonoscopy and enterography study suggested ileal inflammation.  Her fecal calprotectin has been elevated and she has a mild iron deficiency.  Overall it is possible she has mild Crohn's disease as we have previously discussed, however her symptoms seem out of proportion to objective findings to date.  She has failed multiple regimens to manage her symptoms.  Recently given budesonide and her fecal calprotectin has come down however no significant change in her symptoms other than nausea seems somewhat better.  I do think the Remeron is helping her nausea and appetite and will continue that, she has gained 4 pounds since have last seen her.   She has severe underlying anxiety which is impairing her ability to cope with the symptoms.  I suspect she has some functional overlap on top of some mild ileitis.  With her refractory bloating I offered her a trial of rifaximin 550 mg 3 times daily for 2 weeks, we discussed what this entails and she is agreeable to try it.  I do think we can start tapering her down off budesonide, she does not want to stay on steroids, we will go to 6 mg for 2 weeks and then 3 mg for 2 weeks and then stop.  She and husband have a lot of questions about possible underlying Crohn's disease.  We will need to follow her small bowel up at some point in time based on how she is doing, with either a follow-up enterography study or consider capsule endoscopy to better visualize for ulcerations, active inflammation.  We will also continue to trend her fecal calprotectin.  I have asked her to come to the lab next week to recheck her CBC and iron panel now that she has received a few IV iron infusions.  She is agreeable with this, I asked her to touch base with me in a couple weeks and let me know how she is doing with the rifaximin.  She inquires if she can travel out of country for a few weeks to [place] and I think that is okay to do so as long as she is up for it and has access to care there if needed.  She  does not have any high risk stenosis for obstruction/perforation etc.  If she is not better moving forward, has continued active ileitis on follow-up evaluation, may need to consider biologic therapy but have been holding off on that as again unclear how much of the symptoms are being driven by her ileitis versus anxiety/functional disorder.  Plan: - slowly taper down budesonide as outlined - start Rifaximin $RemoveBeforeD'550mg'[id number]$  TID - continue anxiety / nausea regimen including Remeron which is helping nausea - liberalize diet but avoid trigger foods  - repeat CBC, TIBC / ferritin later next week - will consider interval capsule endoscopy or  MRE in the upcoming few months  Jolly Mango, MD [facility]  I spent 40 minutes of time, including in depth chart review, independent review of results as outlined above, face-to-face time with the patient, and documentation.

## 2021-12-20 ENCOUNTER — Encounter: Payer: Self-pay | Admitting: Gastroenterology

## 2021-12-20 ENCOUNTER — Other Ambulatory Visit: Payer: Self-pay | Admitting: *Deleted

## 2021-12-20 NOTE — Telephone Encounter (Signed)
I called patient and left detailed VM on number on DPR.

## 2021-12-20 NOTE — Telephone Encounter (Signed)
Please call the patient. This is hard to figure out on line or the phone, she needs to be seen. If she is really hurting she should go to urgent care. If she can wait until Monday, please put her in at 3:15 with me.

## 2021-12-22 ENCOUNTER — Encounter: Payer: Self-pay | Admitting: Gastroenterology

## 2021-12-23 ENCOUNTER — Ambulatory Visit: Payer: BC Managed Care – PPO | Admitting: Nurse Practitioner

## 2021-12-23 ENCOUNTER — Other Ambulatory Visit: Payer: Self-pay

## 2021-12-23 ENCOUNTER — Ambulatory Visit (INDEPENDENT_AMBULATORY_CARE_PROVIDER_SITE_OTHER): Payer: BC Managed Care – PPO | Admitting: Obstetrics and Gynecology

## 2021-12-23 ENCOUNTER — Encounter: Payer: Self-pay | Admitting: Obstetrics and Gynecology

## 2021-12-23 ENCOUNTER — Encounter: Payer: Self-pay | Admitting: Gastroenterology

## 2021-12-23 VITALS — BP 108/64 | HR 62 | Ht 63.0 in | Wt 119.0 lb

## 2021-12-23 DIAGNOSIS — N764 Abscess of vulva: Secondary | ICD-10-CM | POA: Diagnosis not present

## 2021-12-23 DIAGNOSIS — N952 Postmenopausal atrophic vaginitis: Secondary | ICD-10-CM

## 2021-12-23 DIAGNOSIS — N949 Unspecified condition associated with female genital organs and menstrual cycle: Secondary | ICD-10-CM

## 2021-12-23 LAB — URINALYSIS, COMPLETE
Bilirubin Urine: NEGATIVE
Casts: NONE SEEN /LPF
Crystals: NONE SEEN /HPF
Glucose, UA: NEGATIVE
Hgb urine dipstick: NEGATIVE
Hyaline Cast: NONE SEEN /LPF
Ketones, ur: NEGATIVE
Nitrite: NEGATIVE
Protein, ur: NEGATIVE
RBC / HPF: NONE SEEN /HPF (ref 0–2)
Specific Gravity, Urine: 1.015 (ref 1.001–1.035)
Yeast: NONE SEEN /HPF
pH: 6.5 (ref 5.0–8.0)

## 2021-12-23 LAB — WET PREP FOR TRICH, YEAST, CLUE

## 2021-12-23 MED ORDER — LIDOCAINE 5 % EX OINT
1.0000 | TOPICAL_OINTMENT | Freq: Four times a day (QID) | CUTANEOUS | 0 refills | Status: AC | PRN
Start: 2021-12-23 — End: ?

## 2021-12-23 NOTE — Patient Instructions (Addendum)
Atrophic Vaginitis Atrophic vaginitis is a condition in which the tissues that line the vagina become dry and thin. This condition is most common in women who have stopped having regular menstrual periods (are in menopause). This usually starts when a woman is 58 to 51 years old. That is the time when a woman's estrogen levels begin to decrease. Estrogen is a female hormone. It helps to keep the tissues of the vagina moist. It stimulates the vagina to produce a clear fluid that lubricates the vagina for sex. This fluid also protects the vagina from infection. Lack of estrogen can cause the lining of the vagina to get thinner and dryer. The vagina may also shrink in size. It may become less elastic. Atrophic vaginitis tends to get worse over time as a woman's estrogen level drops. What are the causes? This condition is caused by the normal drop in estrogen that happens around the time of menopause. What increases the risk? Certain conditions or situations may lower a woman's estrogen level, leading to a higher risk for atrophic vaginitis. You are more likely to develop this condition if: You are taking medicines that block estrogen. You have had your ovaries removed. You are being treated for cancer with radiation or medicines (chemotherapy). You have given birth or are breastfeeding. You are older than age 40. You smoke. What are the signs or symptoms? Symptoms of this condition include: Pain, soreness, a feeling of pressure, or bleeding during sex (dyspareunia). Vaginal burning, irritation, or itching. Pain or bleeding when a speculum is used in a vaginal exam. Having burning pain while urinating. Vaginal discharge. In some cases, there are no symptoms. How is this diagnosed? This condition is diagnosed based on your medical history and a physical exam. This will include a pelvic exam that checks the vaginal tissues. Though rare, you may also have other tests, including: A urine test. A test  that checks the acid balance in your vagina (acid balance test). How is this treated? Treatment for this condition depends on how severe your symptoms are. Treatment may include: Using an over-the-counter vaginal lubricant before sex. Using a long-acting vaginal moisturizer. Using low-dose estrogen for moderate to severe symptoms that do not respond to other treatments. Options include creams, tablets, and inserts (vaginal rings). Before you use a vaginal estrogen, tell your health care provider if you have a history of: Breast cancer. Endometrial cancer. Blood clots. If you are not sexually active and your symptoms are very mild, you may not need treatment. Follow these instructions at home: Medicines Take over-the-counter and prescription medicines only as told by your health care provider. Do not use herbal or alternative medicines unless your health care provider says that you can. Use over-the-counter creams, lubricants, or moisturizers for dryness only as told by your health care provider. General instructions If your atrophic vaginitis is caused by menopause, discuss all of your menopause symptoms and treatment options with your health care provider. Do not douche. Do not use products that can make your vagina dry. These include: Scented feminine sprays. Scented tampons. Scented soaps. Vaginal sex can help to improve blood flow and elasticity of vaginal tissue. If you choose to have sex and it hurts, try using a water-soluble lubricant or moisturizer right before having sex. Contact a health care provider if: Your discharge looks different than normal. Your vagina has an unusual smell. You have new symptoms. Your symptoms do not improve with treatment. Your symptoms get worse. Summary Atrophic vaginitis is a condition in  which the tissues that line the vagina become dry and thin. It is most common in women who have stopped having regular menstrual periods (are in  menopause). Treatment options include using vaginal lubricants and low-dose vaginal estrogen. Contact a health care provider if your vagina has an unusual smell, or if your symptoms get worse or do not improve after treatment. This information is not intended to replace advice given to you by your health care provider. Make sure you discuss any questions you have with your health care provider. Document Revised: 04/12/2020 Document Reviewed: 04/12/2020 Elsevier Patient Education  2022 [employer]. Skin Abscess A skin abscess is an infected area on or under your skin that contains a collection of pus and other material. An abscess may also be called a furuncle, carbuncle, or boil. An abscess can occur in or on almost any part of your body. Some abscesses break open (rupture) on their own. Most continue to get worse unless they are treated. The infection can spread deeper into the body and eventually into your blood, which can make you feel ill. Treatment usually involves draining the abscess. What are the causes? An abscess occurs when germs, like bacteria, pass through your skin and cause an infection. This may be caused by: A scrape or cut on your skin. A puncture wound through your skin, including a needle injection or insect bite. Blocked oil or sweat glands. Blocked and infected hair follicles. A cyst that forms beneath your skin (sebaceous cyst) and becomes infected. What increases the risk? This condition is more likely to develop in people who: Have a weak body defense system (immune system). Have diabetes. Have dry and irritated skin. Get frequent injections or use illegal IV drugs. Have a foreign body in a wound, such as a splinter. Have problems with their lymph system or veins. What are the signs or symptoms? Symptoms of this condition include: A painful, firm bump under the skin. A bump with pus at the top. This may break through the skin and drain. Other symptoms  include: Redness surrounding the abscess site. Warmth. Swelling of the lymph nodes (glands) near the abscess. Tenderness. A sore on the skin. How is this diagnosed? This condition may be diagnosed based on: A physical exam. Your medical history. A sample of pus. This may be used to find out what is causing the infection. Blood tests. Imaging tests, such as an ultrasound, CT scan, or MRI. How is this treated? A small abscess that drains on its own may not need treatment. Treatment for larger abscesses may include: Moist heat or heat pack applied to the area several times a day. A procedure to drain the abscess (incision and drainage). Antibiotic medicines. For a severe abscess, you may first get antibiotics through an IV and then change to antibiotics by mouth. Follow these instructions at home: Medicines  Take over-the-counter and prescription medicines only as told by your health care provider. If you were prescribed an antibiotic medicine, take it as told by your health care provider. Do not stop taking the antibiotic even if you start to feel better. Abscess care  If you have an abscess that has not drained, apply heat to the affected area. Use the heat source that your health care provider recommends, such as a moist heat pack or a heating pad. Place a towel between your skin and the heat source. Leave the heat on for 20-30 minutes. Remove the heat if your skin turns bright red. This is especially important  if you are unable to feel pain, heat, or cold. You may have a greater risk of getting burned. Follow instructions from your health care provider about how to take care of your abscess. Make sure you: Cover the abscess with a bandage (dressing). Change your dressing or gauze as told by your health care provider. Wash your hands with soap and water before you change the dressing or gauze. If soap and water are not available, use hand sanitizer. Check your abscess every day for  signs of a worsening infection. Check for: More redness, swelling, or pain. More fluid or blood. Warmth. More pus or a bad smell. General instructions To avoid spreading the infection: Do not share personal care items, towels, or hot tubs with others. Avoid making skin contact with other people. Keep all follow-up visits as told by your health care provider. This is important. Contact a health care provider if you have: More redness, swelling, or pain around your abscess. More fluid or blood coming from your abscess. Warm skin around your abscess. More pus or a bad smell coming from your abscess. A fever. Muscle aches. Chills or a general ill feeling. Get help right away if you: Have severe pain. See red streaks on your skin spreading away from the abscess. Summary A skin abscess is an infected area on or under your skin that contains a collection of pus and other material. A small abscess that drains on its own may not need treatment. Treatment for larger abscesses may include having a procedure to drain the abscess and taking an antibiotic. This information is not intended to replace advice given to you by your health care provider. Make sure you discuss any questions you have with your health care provider. Document Revised: 02/03/2019 Document Reviewed: 11/26/2017 Elsevier Patient Education  2022 [employer].

## 2021-12-23 NOTE — Progress Notes (Signed)
GYNECOLOGY  VISIT   HPI: 51 y.o.   Married Chelsea Bradshaw Unavailable  female   [phone] with Patient's last menstrual period was 08/27/2021.   here for f/u of a vulvar sore that started on 12/13/21, feels like it is improving. Some vulvar burning, tender to wipe, getting better.  No vaginal discharge, no odor, no itching. She is on oral steroid for GI issues.   She is having lower back pain. She is in PT for her back pain.   GYNECOLOGIC HISTORY: Patient's last menstrual period was 08/27/2021. 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   Vertigo 07/19/2019   Iron deficiency anemia 07/12/2019   Vitamin D deficiency 07/12/2019   Throat pain 06/22/2019   Chronic right ear pain 03/28/2019   Anxiety about health 02/18/2019   Irritable bowel syndrome 02/18/2019   GAD (generalized anxiety disorder) 01/31/2019   Bilateral temporomandibular joint pain 01/10/2019   Dysfunction of right eustachian tube 01/10/2019    Past Medical History:  Diagnosis Date   Anxiety    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 30 tablet    escitalopram  (LEXAPRO) 10 MG tablet Take 10 mg by mouth daily.     gabapentin (NEURONTIN) 100 MG capsule Take 2 capsules by mouth 2 (two) times daily.     mirtazapine (REMERON) 15 MG tablet Take 15 mg by mouth at bedtime.     pantoprazole (PROTONIX) 40 MG tablet Take 1 tablet (40 mg total) by mouth daily. 90 tablet 1   promethazine (PHENERGAN) 12.5 MG tablet Take 12.5-25 mg by mouth every 6 (six) hours as needed.     rifaximin (XIFAXAN) 550 MG TABS tablet Take 1 tablet (550 mg total) by mouth 3 (three) times daily for 14 days. 42 tablet 0   Vitamin D, Ergocalciferol, (DRISDOL) 1.25 MG (50000 UNIT) CAPS capsule Take 50,000 Units by mouth once a week.     No current facility-administered medications for this visit.   Facility-Administered Medications Ordered in Other Visits  Medication Dose Route Frequency Provider Last Rate Last Admin   albuterol (VENTOLIN HFA) 108 (90 Base) MCG/ACT inhaler 2 puff  2 puff Inhalation Once PRN Armbruster, Carlota Raspberry, MD       diphenhydrAMINE (BENADRYL) injection 50 mg  50 mg Intravenous Once PRN Armbruster, Carlota Raspberry, MD       EPINEPHrine (EPI-PEN) injection 0.3 mg  0.3 mg Intramuscular Once PRN Armbruster, Carlota Raspberry, MD       famotidine (PEPCID) IVPB 20 mg premix  20 mg Intravenous Once PRN Armbruster, Carlota Raspberry, MD       methylPREDNISolone sodium succinate (SOLU-MEDROL) 125 mg/2 mL injection 125 mg  125 mg Intravenous Once PRN Armbruster, Carlota Raspberry, MD         ALLERGIES: Patient has no known allergies.  Family History  Problem Relation Age of Onset   Healthy Mother    Healthy Father    Colon cancer Neg Hx    Colon polyps Neg Hx    Esophageal cancer Neg Hx    Rectal cancer Neg Hx    Stomach cancer Neg Hx     Social History   Socioeconomic History   Marital status: Married    Spouse name: Not on file   Number of children: 1   Years of education: Not on file   Highest education level: Not on file  Occupational History   Not on file  Tobacco Use   Smoking status:  Never   Smokeless tobacco: Never  Vaping Use   Vaping Use: Never used  Substance and Sexual Activity   Alcohol use: Not Currently    Comment: wine occasionally couple times a year   Drug use: Never   Sexual activity: Not Currently  Other Topics Concern   Not on file  Social History Narrative   Right handed    Caffeine 2 cups per day.    Social Determinants of Health   Financial Resource Strain: Not on file  Food Insecurity: Not on file  Transportation Needs: Not on file  Physical Activity: Not on file  Stress: Not on file  Social Connections: Not on file  Intimate Partner Violence: Not on file    Review of Systems  Genitourinary:  Positive for dysuria and flank pain.   PHYSICAL EXAMINATION:    BP 108/64    Pulse 62    Ht 5\' 3"  (1.6 m)    Wt 119 lb (54 kg)    LMP 08/27/2021    SpO2 99%    BMI 21.08 kg/m     General appearance: alert, cooperative and appears stated age  Pelvic: External genitalia:  she has a 5-6 mm boil on her right labia minora, tender, no surrounding cellulitis.               Urethra:  normal appearing urethra with no masses, tenderness or lesions              Bartholins and Skenes: normal                 Vagina: mildly atrophic appearing vagina with normal color and discharge, no lesions              Cervix: no lesions               Chaperone was present for exam.  1. Boil, vulva Recommended warm compresses, hot soaks - lidocaine (XYLOCAINE) 5 % ointment; Apply 1 application topically 4 (four) times daily as needed.  Dispense: 30 g; Refill: 0 -Call if it is enlarging or not resolving (she feels it is improving).  2. Vaginal burning - WET PREP FOR TRICH, YEAST, CLUE: negative - Urinalysis, Complete  3. Vaginal atrophy Information given, she can try OTC vaginal moisturizers Vaginal estrogen  is another option

## 2021-12-24 ENCOUNTER — Encounter: Payer: Self-pay | Admitting: Obstetrics and Gynecology

## 2021-12-25 ENCOUNTER — Encounter: Payer: Self-pay | Admitting: Obstetrics and Gynecology

## 2021-12-25 ENCOUNTER — Ambulatory Visit (INDEPENDENT_AMBULATORY_CARE_PROVIDER_SITE_OTHER): Payer: BC Managed Care – PPO | Admitting: Obstetrics and Gynecology

## 2021-12-25 ENCOUNTER — Other Ambulatory Visit: Payer: Self-pay

## 2021-12-25 ENCOUNTER — Ambulatory Visit: Payer: BC Managed Care – PPO | Admitting: Family Medicine

## 2021-12-25 VITALS — BP 100/67 | HR 66 | Wt 118.0 lb

## 2021-12-25 DIAGNOSIS — N764 Abscess of vulva: Secondary | ICD-10-CM | POA: Diagnosis not present

## 2021-12-25 DIAGNOSIS — R3989 Other symptoms and signs involving the genitourinary system: Secondary | ICD-10-CM | POA: Diagnosis not present

## 2021-12-25 DIAGNOSIS — N898 Other specified noninflammatory disorders of vagina: Secondary | ICD-10-CM | POA: Diagnosis not present

## 2021-12-25 LAB — URINALYSIS, COMPLETE
Bacteria, UA: NONE SEEN /HPF
Bilirubin Urine: NEGATIVE
Glucose, UA: NEGATIVE
Hgb urine dipstick: NEGATIVE
Hyaline Cast: NONE SEEN /LPF
Ketones, ur: NEGATIVE
Leukocytes,Ua: NEGATIVE
Nitrite: NEGATIVE
Protein, ur: NEGATIVE
RBC / HPF: NONE SEEN /HPF (ref 0–2)
Specific Gravity, Urine: 1.01 (ref 1.001–1.035)
WBC, UA: NONE SEEN /HPF (ref 0–5)
pH: 6 (ref 5.0–8.0)

## 2021-12-25 NOTE — Progress Notes (Signed)
GYNECOLOGY  VISIT ?  ?HPI: ?51 y.o.   Married Unavailable Unavailable  female   ?G1P1001 with No LMP recorded. (Menstrual status: Irregular Periods).   ?here for pain after urinating, she is also having lower back pain and leg pain.  ?She had a one time episode of pain in her lower abdomen/bladder after voiding. No dysuria. She is voiding a lot, but drinking a lot.  ? ?She has been using warm compresses on the boil on her vulva. She feels like it is getting better.  ? ?She is having vaginal dryness, I recommended she try replens vaginal moisturizer. She is worried to insert it and asked me to do it.  ? ?GYNECOLOGIC HISTORY: ?No LMP recorded. (Menstrual status: Irregular Periods). ?Contraception:pmp  ?Menopausal hormone therapy: none  ?       ?OB History   ? ? Gravida  ?1  ? Para  ?1  ? Term  ?1  ? Preterm  ?   ? AB  ?   ? Living  ?1  ?  ? ? SAB  ?   ? IAB  ?   ? Ectopic  ?   ? Multiple  ?   ? Live Births  ?1  ?   ?  ?  ?    ? ?Patient Active Problem List  ? Diagnosis Date Noted  ? Tongue pain 12/18/2020  ? 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 Vertigo 07/19/2019  ? Iron deficiency anemia 07/12/2019  ? Vitamin D deficiency 07/12/2019  ? Throat pain 06/22/2019  ? Chronic right ear pain 03/28/2019  ? Anxiety about health 02/18/2019  ? Irritable bowel syndrome 02/18/2019  ? GAD (generalized anxiety disorder) 01/31/2019  ? Bilateral temporomandibular joint pain 01/10/2019  ? Dysfunction of right eustachian tube 01/10/2019  ? ? ?Past Medical History:  ?Diagnosis Date  ? Anxiety   ? 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 09-13-21--pt states burning pain of tongue since covid  ? Ear pain   ? right some times in the left   ? Gastritis   ? IBS (irritable bowel syndrome)   ? Ileitis   ? Vertigo 07/19/2019  ? ? ?Past Surgical History:  ?Procedure Laterality Date  ? CESAREAN SECTION    ? UPPER GASTROINTESTINAL ENDOSCOPY  09/13/2021  ? 2019-[place], 2017 had 2  ? ? ?Current Outpatient Medications   ?Medication Sig Dispense Refill  ? clonazePAM (KLONOPIN) 0.5 MG tablet Take 0.5 tablets (0.25 mg total) by mouth in the morning, at noon, and at bedtime. 30 tablet   ? escitalopram (LEXAPRO) 10 MG tablet Take 10 mg by mouth daily.    ? gabapentin (NEURONTIN) 100 MG capsule Take 2 capsules by mouth 2 (two) times daily.    ? lidocaine (XYLOCAINE) 5 % ointment Apply 1 application topically 4 (four) times daily as needed. 30 g 0  ? mirtazapine (REMERON) 15 MG tablet Take 15 mg by mouth at bedtime.    ? pantoprazole (PROTONIX) 40 MG tablet Take 1 tablet (40 mg total) by mouth daily. 90 tablet 1  ? promethazine (PHENERGAN) 12.5 MG tablet Take 12.5-25 mg by mouth every 6 (six) hours as needed.    ? rifaximin (XIFAXAN) 550 MG TABS tablet Take 1 tablet (550 mg total) by mouth 3 (three) times daily for 14 days. 42 tablet 0  ? Vitamin D, Ergocalciferol, (DRISDOL) 1.25 MG (50000 UNIT) CAPS capsule Take 50,000 Units by mouth once a week.    ? ?No current facility-administered medications  for this visit.  ? ?Facility-Administered Medications Ordered in Other Visits  ?Medication Dose Route Frequency Provider Last Rate Last Admin  ? albuterol (VENTOLIN HFA) 108 (90 Base) MCG/ACT inhaler 2 puff  2 puff Inhalation Once PRN Armbruster, Carlota Raspberry, MD      ? diphenhydrAMINE (BENADRYL) injection 50 mg  50 mg Intravenous Once PRN Armbruster, Carlota Raspberry, MD      ? EPINEPHrine (EPI-PEN) injection 0.3 mg  0.3 mg Intramuscular Once PRN Armbruster, Carlota Raspberry, MD      ? famotidine (PEPCID) IVPB 20 mg premix  20 mg Intravenous Once PRN Armbruster, Carlota Raspberry, MD      ? methylPREDNISolone sodium succinate (SOLU-MEDROL) 125 mg/2 mL injection 125 mg  125 mg Intravenous Once PRN Armbruster, Carlota Raspberry, MD      ?  ? ?ALLERGIES: Patient has no known allergies. ? ?Family History  ?Problem Relation Age of Onset  ? Healthy Mother   ? Healthy Father   ? Colon cancer Neg Hx   ? Colon polyps Neg Hx   ? Esophageal cancer Neg Hx   ? Rectal cancer Neg Hx   ? Stomach  cancer Neg Hx   ? ? ?Social History  ? ?Socioeconomic History  ? Marital status: Married  ?  Spouse name: Not on file  ? Number of children: 1  ? Years of education: Not on file  ? Highest education level: Not on file  ?Occupational History  ? Not on file  ?Tobacco Use  ? Smoking status: Never  ? Smokeless tobacco: Never  ?Vaping Use  ? Vaping Use: Never used  ?Substance and Sexual Activity  ? Alcohol use: Not Currently  ?  Comment: wine occasionally couple times a year  ? Drug use: Never  ? Sexual activity: Not Currently  ?Other Topics Concern  ? Not on file  ?Social History Narrative  ? Right handed   ? Caffeine 2 cups per day.   ? ?Social Determinants of Health  ? ?Financial Resource Strain: Not on file  ?Food Insecurity: Not on file  ?Transportation Needs: Not on file  ?Physical Activity: Not on file  ?Stress: Not on file  ?Social Connections: Not on file  ?Intimate Partner Violence: Not on file  ? ? ?Review of Systems  ?All other systems reviewed and are negative. ? ?PHYSICAL EXAMINATION:   ? ?BP 100/67   Pulse 66   Wt 118 lb (53.5 kg)   SpO2 100%   BMI 20.90 kg/m?     ?General appearance: alert, cooperative and appears stated age ?Abdomen: soft, non-tender; non distended, no masses,  no organomegaly ? ?Pelvic: External genitalia:  4 mm boil on the right labia minora, less swollen than 2 days ago. ?             Urethra:  normal appearing urethra with no masses, tenderness or lesions ?             Bartholins and Skenes: normal    ?             Vagina: replens vaginal moisturizer placed in her vagina ? ?Chaperone was present for exam. ? ?1. Boil, vulva ?Slowly resolving, no signs of surrounding infection ?Continue with warm compresses and hot soaks ? ?2. Bladder pain ?Patient desires repeat urinalysis ?- Urinalysis, Complete: negative ? ?3. Vaginal dryness ?Replens vaginal moisturizer placed.  ? ?

## 2021-12-26 ENCOUNTER — Encounter: Payer: Self-pay | Admitting: Gastroenterology

## 2021-12-26 ENCOUNTER — Other Ambulatory Visit (INDEPENDENT_AMBULATORY_CARE_PROVIDER_SITE_OTHER): Payer: BC Managed Care – PPO

## 2021-12-26 DIAGNOSIS — D508 Other iron deficiency anemias: Secondary | ICD-10-CM

## 2021-12-26 DIAGNOSIS — K529 Noninfective gastroenteritis and colitis, unspecified: Secondary | ICD-10-CM | POA: Diagnosis not present

## 2021-12-26 DIAGNOSIS — E559 Vitamin D deficiency, unspecified: Secondary | ICD-10-CM

## 2021-12-26 DIAGNOSIS — R140 Abdominal distension (gaseous): Secondary | ICD-10-CM

## 2021-12-26 DIAGNOSIS — K589 Irritable bowel syndrome without diarrhea: Secondary | ICD-10-CM

## 2021-12-26 DIAGNOSIS — R110 Nausea: Secondary | ICD-10-CM

## 2021-12-26 LAB — CBC
HCT: 35.9 % — ABNORMAL LOW (ref 36.0–46.0)
Hemoglobin: 12.2 g/dL (ref 12.0–15.0)
MCHC: 33.8 g/dL (ref 30.0–36.0)
MCV: 94.6 fl (ref 78.0–100.0)
Platelets: 280 10*3/uL (ref 150.0–400.0)
RBC: 3.8 Mil/uL — ABNORMAL LOW (ref 3.87–5.11)
RDW: 15.8 % — ABNORMAL HIGH (ref 11.5–15.5)
WBC: 6 10*3/uL (ref 4.0–10.5)

## 2021-12-26 LAB — IBC + FERRITIN
Ferritin: 190.6 ng/mL (ref 10.0–291.0)
Iron: 105 ug/dL (ref 42–145)
Saturation Ratios: 37.1 % (ref 20.0–50.0)
TIBC: 282.8 ug/dL (ref 250.0–450.0)
Transferrin: 202 mg/dL — ABNORMAL LOW (ref 212.0–360.0)

## 2021-12-26 LAB — VITAMIN B12: Vitamin B-12: 340 pg/mL (ref 211–911)

## 2021-12-26 LAB — VITAMIN D 25 HYDROXY (VIT D DEFICIENCY, FRACTURES): VITD: 49.52 ng/mL (ref 30.00–100.00)

## 2021-12-26 NOTE — Telephone Encounter (Signed)
Chelsea Bradshaw the patient needs CBC, TIBC / ferritin, B12, vitamin D25 [place] if you can help order those. I will let her know she can go to the lab. Thanks ? ?Lab orders in epic. ?

## 2021-12-27 ENCOUNTER — Encounter: Payer: Self-pay | Admitting: Obstetrics and Gynecology

## 2021-12-27 ENCOUNTER — Telehealth: Payer: Self-pay

## 2021-12-27 ENCOUNTER — Telehealth: Payer: Self-pay | Admitting: Gastroenterology

## 2021-12-27 NOTE — Telephone Encounter (Signed)
Called [facility] and left message for a call back to check on status of PA for Xifaxan.  The portal is not allowing me to signin or rest my password. ?

## 2021-12-27 NOTE — Telephone Encounter (Signed)
See alternate telephone encounter from today.

## 2021-12-27 NOTE — Telephone Encounter (Signed)
Patient called wanting to follow up with Dr. Havery Moros in the [facility] but asked for another virtual visit. Please advise. ?

## 2021-12-27 NOTE — Telephone Encounter (Signed)
Scheduled patient and sent her a MyChart message ?

## 2021-12-27 NOTE — Telephone Encounter (Signed)
The patient did not want to provide a lot of information she said she wanted to follow up from her last OV. ?

## 2021-12-28 NOTE — Telephone Encounter (Signed)
Xifaxan PA approved ?

## 2021-12-29 ENCOUNTER — Encounter: Payer: Self-pay | Admitting: Gastroenterology

## 2021-12-30 NOTE — Telephone Encounter (Signed)
LM for [facility] inquiring about patient's copay ?

## 2021-12-30 NOTE — Telephone Encounter (Signed)
Called [facility].  They could not determine copay at this time, although it might be $0. They are transferring to another pharmacy that is able to fill as part of her insurance ?

## 2021-12-31 ENCOUNTER — Encounter: Payer: Self-pay | Admitting: Gastroenterology

## 2022-01-01 ENCOUNTER — Encounter: Payer: Self-pay | Admitting: Gastroenterology

## 2022-01-01 ENCOUNTER — Encounter: Payer: Self-pay | Admitting: Obstetrics and Gynecology

## 2022-01-02 ENCOUNTER — Encounter: Payer: Self-pay | Admitting: Gastroenterology

## 2022-01-05 ENCOUNTER — Encounter: Payer: Self-pay | Admitting: Gastroenterology

## 2022-01-06 ENCOUNTER — Encounter: Payer: Self-pay | Admitting: Gastroenterology

## 2022-01-06 MED ORDER — RIFAXIMIN 550 MG PO TABS: 550.0000 mg | ORAL_TABLET | Freq: Three times a day (TID) | ORAL | 0 refills | Status: AC

## 2022-01-06 NOTE — Telephone Encounter (Signed)
Called and spoke with Aldona Bar at [facility]. I was told that they transferred the [facility] prescription to pt's local pharmacy, I told them that pt's husband went to the pharmacy twice and they did not have the RX. I was told that I could send a new RX because no PA was required or they would call and give a verbal transfer. I told Aldona Bar that I would just send in a new RX. I notified patient of this information via my chart. ?

## 2022-01-07 MED ORDER — PROMETHAZINE HCL 12.5 MG PO TABS
12.5000 mg | ORAL_TABLET | Freq: Four times a day (QID) | ORAL | 0 refills | Status: AC | PRN
Start: 2022-01-07 — End: ?

## 2022-01-07 MED ORDER — PANTOPRAZOLE SODIUM 40 MG PO TBEC: 40.0000 mg | DELAYED_RELEASE_TABLET | Freq: Every day | ORAL | 1 refills | Status: AC

## 2022-01-07 NOTE — Telephone Encounter (Signed)
Havier Deeb can you please refill the Chelsea Bradshaw's pantoprazole and promethazine.  Thanks, I will send her message otherwise.   ? ?Prescriptions refilled per MD request.  ?

## 2022-01-08 ENCOUNTER — Ambulatory Visit: Payer: Self-pay

## 2022-01-08 ENCOUNTER — Ambulatory Visit (INDEPENDENT_AMBULATORY_CARE_PROVIDER_SITE_OTHER): Payer: BC Managed Care – PPO | Admitting: Surgery

## 2022-01-08 ENCOUNTER — Other Ambulatory Visit: Payer: Self-pay

## 2022-01-08 ENCOUNTER — Telehealth: Payer: Self-pay

## 2022-01-08 DIAGNOSIS — M5442 Lumbago with sciatica, left side: Secondary | ICD-10-CM | POA: Diagnosis not present

## 2022-01-08 DIAGNOSIS — M5441 Lumbago with sciatica, right side: Secondary | ICD-10-CM

## 2022-01-08 NOTE — Telephone Encounter (Signed)
Pt came back and wanted L-spine xray  ?Obtained xrays on her  ?

## 2022-01-08 NOTE — Telephone Encounter (Signed)
Patient called into the office and wanted to know if she can come back into the office to have xrays done. She stated she was just seen in the office today.  ?

## 2022-01-09 ENCOUNTER — Telehealth: Payer: Self-pay | Admitting: Orthopaedic Surgery

## 2022-01-09 NOTE — Telephone Encounter (Signed)
Pt is calling wanting to know about the xray and the plan of care. She states if you can respond via mychart that would be great as she is leaving for [place] next week  ?

## 2022-01-09 NOTE — Telephone Encounter (Signed)
Pt is calling back

## 2022-01-10 ENCOUNTER — Telehealth: Payer: Self-pay | Admitting: Orthopaedic Surgery

## 2022-01-10 ENCOUNTER — Encounter: Payer: Self-pay | Admitting: Gastroenterology

## 2022-01-10 NOTE — Telephone Encounter (Signed)
Pt called requesting copies of her xrays on paper. Pt states she will come and wait for them to be done. Please print xrays for pt. Phone number is [phone]. ?

## 2022-01-10 NOTE — Telephone Encounter (Signed)
Pt called again

## 2022-01-11 ENCOUNTER — Encounter: Payer: Self-pay | Admitting: Orthopaedic Surgery

## 2022-01-13 ENCOUNTER — Other Ambulatory Visit: Payer: Self-pay | Admitting: Student in an Organized Health Care Education/Training Program

## 2022-01-13 ENCOUNTER — Telehealth: Payer: Self-pay | Admitting: Radiology

## 2022-01-13 DIAGNOSIS — M5416 Radiculopathy, lumbar region: Secondary | ICD-10-CM

## 2022-01-13 NOTE — Telephone Encounter (Signed)
Please advise 

## 2022-01-13 NOTE — Telephone Encounter (Signed)
noted 

## 2022-01-13 NOTE — Telephone Encounter (Signed)
I got a message saying patient had requested paper copy of her x-rays, I called patient and let her know they were ready and she stated she didn't need the actual x-rays, she needed the report. She was asking if Dr. Lorin Mercy could document in her chart that she is safe to travel, she said he called and explained everything to her but she could not remember what he told her and she needs it in writing either in her chart where she can view it on mychart or printed. Could you please assist her with this? Her callback number is [phone].  ?

## 2022-01-14 ENCOUNTER — Ambulatory Visit
Admission: RE | Admit: 2022-01-14 | Discharge: 2022-01-14 | Disposition: A | Discharge status: Home / Self Care | Payer: BC Managed Care – PPO | Source: Ambulatory Visit | Attending: Student in an Organized Health Care Education/Training Program | Admitting: Student in an Organized Health Care Education/Training Program

## 2022-01-14 DIAGNOSIS — M5416 Radiculopathy, lumbar region: Secondary | ICD-10-CM

## 2022-01-14 NOTE — Progress Notes (Signed)
? ?Office Visit Note ?  ?Patient: Chelsea Bradshaw           ?Date of Birth: [birth year]           ?MRN: [id number] ?Visit Date: 01/08/2022 ?             ?Requested by: Mancel Bale, PA-C ?[address] ?[place],  [place] [address] ?PCP: Mancel Bale, PA-C ? ? ?Assessment & Plan: ?Visit Diagnoses:  ?1. Acute midline low back pain with bilateral sciatica   ? ? ?Plan: After long visit with patient today and and her repeated refusal of getting x-rays today in the clinic I advised her that I recommend that she go to formal PT and follow-up with Dr. Lorin Mercy in 6 weeks for recheck.  I advised patient that I did have a phone conversation with Windell Hummingbird, PA-C at her primary care provider office.  Multiple times patient asked if I got approval from her PCP to get x-rays since she was concerned about radiation.  She had also asked me if she allows me to get x-rays will that tell me whether or not she should travel to [place].  I advised her that I absolutely cannot tell her that and that it is solely up to her whether or not she makes the flight and travels to [place].  Patient had also repeatedly asked if I could just order a lumbar MRI and I told her that I would not be ordering that today and since her primary care provider Windell Hummingbird, PA has been managing her problem conservatively that she could order the scan through her office.  I advised patient that Windell Hummingbird said that she is not going to order an MRI.  Patient can follow-up with her in a couple of weeks. ? ?Follow-Up Instructions: Return in about 6 weeks (around 02/19/2022) for WITH DR [place] RECHECK LUMBAR.  ? ?Orders:  ?No orders of the defined types were placed in this encounter. ? ?No orders of the defined types were placed in this encounter. ? ? ? ? Procedures: ?No procedures performed ? ? ?Clinical Data: ?No additional findings. ? ? ?Subjective: ?Chief Complaint  ?Patient presents with  ? Lower Back - Pain  ? ? ?HPI ?51 year old female who is new  patient to clinic is referred by Windell Hummingbird, PA-C for chronic back pain.  I reviewed patient's chart and she was seen by Dr. Bernerd Limbo with [facility] January 01, 2022 and she was prescribed a prednisone taper.  She was then seen by Windell Hummingbird, PA-C at the same family medicine office January 03, 2022 and January 07, 2022.  Under care everywhere patient's most recent x-rays of her lumbar spine were taken April 02, 2021.  I had my assistant order new lumbar spine films today in the office but then patient refused.  She said that she had already had previous imaging studies which included an MRI scan from 2019.  My assistant multiple times advised her that we needed new x-rays today and again she refused.  I spoke with patient myself advising her that I needed lumbar spine x-rays today since that was her area of concern and again she refused.  She asked me "can't you just get an MRI scan".  I told her that I cannot just order an MRI scan and her primary care provider Windell Hummingbird should be able to so since she has been treating her.  I reviewed Lorenz Coaster note from January 07, 2022 and she documented that patient continues to push for an MRI which she continues to think is not necessary.  Due to patient's refusal of getting x-rays today I did reach out to Windell Hummingbird and had a several minute conversation with her. ?When I did start my official visit with patient she advised me that she has been having problems with her back for several years.  I did find an old lumbar MRI report from December 15, 2017 and that report showed L4-5 there is a faint posterior annular fissure and a mild broad-based central disc protrusion which causes slight anterior thecal indentation and mild lateral recess narrowing.  There is minimal inferior foraminal narrowing bilaterally.  L5-S1 there is a posterior annular fissure and minimal associated broad-based central disc protrusion which causes slight anterior thecal indentation.  No  significant spinal canal or lateral recess stenosis.  X-ray report from June 2022 ordered by [facility] provider showed: ? ?Dover, Dennard Schaumann, MD - 04/02/2021  ?Formatting of this note might be different from the original.  ?EXAM: XR SPINE LUMBAR 2-3 VIEWS  ? ?INDICATION: Dorsalgia, unspecified  ? ?COMPARISON: None  ? ?FINDINGS: There are 5 lumbar-type vertebral bodies. The AP and lateral alignment are normal. The pedicles and spinous processes are intact at all levels. There is a nondisplaced, well-corticated anterior inferior endplate fracture at L4. The vertebral body heights and disc spaces are maintained. Mild osteophytosis is present at all levels.  ? ? ?IMPRESSION: Chronic appearing inferior endplate fracture at L4.  ? ?Electronically Signed by: Charlett Lango on 04/02/2021 1:53 PM ? ?Patient states that her back pain is a 2-3 out of 10.  Reports as being mild and in the low back.  Occasionally goes down both legs with a dull ache.  States that she is unable to take oral NSAIDs.  States that she also did not take the prednisone taper that was given by Dr. Coletta Memos.  States that she was told by Windell Hummingbird, PA not to take that medication.   ? ? ? ?Review of Systems ?No current cardiopulmonary GI/GU issues ? ?Objective: ?Vital Signs: There were no vitals taken for this visit. ? ?Physical Exam ?HENT:  ?   Head: Normocephalic and atraumatic.  ?Eyes:  ?   Extraocular Movements: Extraocular movements intact.  ?Pulmonary:  ?   Effort: No respiratory distress.  ?Musculoskeletal:  ?   Comments: Gait is normal.  Good lumbar flexion extension without discomfort.  No lumbar paraspinal tenderness.  Negative logroll bilateral hips.  Negative straight leg raise.  Patient does have bilateral hamstring tightness left greater than right.  Nontender over the bilateral hip greater trochanter bursa.  Nontender over the SI joints.  Bilateral calves nontender.  No focal motor deficits.  ?Neurological:  ?   Mental Status: She is alert and  oriented to person, place, and time.  ?Psychiatric:  ?   Comments: Patient is somewhat anxious and uncooperative.  ? ? ?Ortho Exam ? ?Specialty Comments:  ?No specialty comments available. ? ?Imaging: ?No results found. ? ? ?PMFS History: ?Patient Active Problem List  ? Diagnosis Date Noted  ? Change in bowel habits 12/03/2021  ? 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 Ileitis 12/03/2021  ? Tongue pain 12/18/2020  ? 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 Vertigo 07/19/2019  ? Iron deficiency anemia 07/12/2019  ? Vitamin D deficiency 07/12/2019  ? Throat pain 06/22/2019  ? Chronic right ear pain 03/28/2019  ? Anxiety about health 02/18/2019  ? Irritable bowel syndrome  02/18/2019  ? GAD (generalized anxiety disorder) 01/31/2019  ? Bilateral temporomandibular joint pain 01/10/2019  ? Dysfunction of right eustachian tube 01/10/2019  ? ?Past Medical History:  ?Diagnosis Date  ? Anxiety   ? 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 09-13-21--pt states burning pain of tongue since covid  ? Ear pain   ? right some times in the left   ? Gastritis   ? IBS (irritable bowel syndrome)   ? Ileitis   ? Vertigo 07/19/2019  ?  ?Family History  ?Problem Relation Age of Onset  ? Healthy Mother   ? Healthy Father   ? Colon cancer Neg Hx   ? Colon polyps Neg Hx   ? Esophageal cancer Neg Hx   ? Rectal cancer Neg Hx   ? Stomach cancer Neg Hx   ?  ?Past Surgical History:  ?Procedure Laterality Date  ? CESAREAN SECTION    ? UPPER GASTROINTESTINAL ENDOSCOPY  09/13/2021  ? 2019-[place], 2017 had 2  ? ?Social History  ? ?Occupational History  ? Not on file  ?Tobacco Use  ? Smoking status: Never  ? Smokeless tobacco: Never  ?Vaping Use  ? Vaping Use: Never used  ?Substance and Sexual Activity  ? Alcohol use: Not Currently  ?  Comment: wine occasionally couple times a year  ? Drug use: Never  ? Sexual activity: Not Currently  ? ? ? ? ? ? ?

## 2022-01-16 ENCOUNTER — Encounter: Payer: Self-pay | Admitting: Obstetrics and Gynecology

## 2022-01-17 ENCOUNTER — Encounter: Payer: Self-pay | Admitting: Surgery

## 2022-01-21 NOTE — Progress Notes (Signed)
Patient was not seen by provider for this particular encounter ?

## 2022-01-28 ENCOUNTER — Telehealth (INDEPENDENT_AMBULATORY_CARE_PROVIDER_SITE_OTHER): Payer: BC Managed Care – PPO | Admitting: Gastroenterology

## 2022-01-28 DIAGNOSIS — R110 Nausea: Secondary | ICD-10-CM | POA: Diagnosis not present

## 2022-01-28 DIAGNOSIS — R140 Abdominal distension (gaseous): Secondary | ICD-10-CM

## 2022-01-28 DIAGNOSIS — K529 Noninfective gastroenteritis and colitis, unspecified: Secondary | ICD-10-CM | POA: Diagnosis not present

## 2022-01-28 DIAGNOSIS — F411 Generalized anxiety disorder: Secondary | ICD-10-CM

## 2022-01-28 NOTE — Progress Notes (Signed)
? ?HPI :  ?THIS ENCOUNTER IS A VIRTUAL VISIT USING VIDEO ENABLED CHAT THROUGH Epic. PATIENT WAS NOT SEEN IN THE OFFICE. PATIENT HAS CONSENTED TO VIRTUAL VISIT / TELEMEDICINE VISIT ?  ?Location of patient: parent's house in [place] ?Location of provider: office ?Persons participating: myself, patient, patient's mother and father ?Time spent on call:  25 minutes ?  ?HPI :  ?51 year old female here for a follow-up virtual visit, accompanied by both her parents today.  She is doing a Hospital doctor call from [place]. Please see prior notes for extensive history.  Recall she has symptoms of persistent nausea, chronic bloating, and had a relatively recent colonoscopy and CT scan enterography which showed some mild ileitis.  She has had a fecal calprotectin of the 150s and mild iron deficiency anemia.  She has had severe anxiety associated with this, her symptoms have been challenging to treat.  Recall she has had the symptoms in the past and an extensive evaluation in multiple locations internationally over recent years, in [place], and [place] as outlined below.  ? ?Since I last saw her she was able to find a colonoscopy report from [place] in 2017 which did show ileitis.  She states she was never aware of this and was never told she had Crohn's disease. ?In recent months recall I gave her a course of budesonide 9 mg daily for 4 weeks with a slow taper.  She is also been on pantoprazole for reflux, Phenergan for nausea, Remeron and Klonopin for anxiety and appetite.  She is also had a mild iron deficiency anemia which resolved with IV iron. ?ESR and CRP have been pretty normal, she had a follow-up fecal calprotectin with budesonide that dropped to 89. ? ?At her last visit she had been improved on the regimen and symptoms were slowly getting better.  Her nausea was not as bad and she has been trying to expand her diet.  Since have seen her she has gone to [place] to visit family.  She has been there a few weeks now and feeling  somewhat better per her report.  Seems like her anxiety is much better controlled.  She has not had much nausea or bloating since she has been there, had a few days recently where this was an issue but generally doing better.  Had previously given her a free sample of rifaximin to treat her predominant bloating symptoms.  She states she never took this, is trying to avoid medications, but she did bring it with her to [place] in case it was needed. ? ?She is currently off all budesonide, she has tapered her Protonix to 20 mg daily.  She has cut her Remeron in half to 7.5 mg and has decreased her Klonopin from 3 times a day to twice daily.  She continues to take Lexapro 10 mg daily. ?  ?In general she is feeling better, feels much better around her family, again eating better and inquires about what dietary options she can expand to at this point time.  She is hoping to come off medications if she can.  We reviewed her course at length. ?  ?Prior workup: ?H  Breath test negative 02/07/19 ?  ?She has had a colonoscopy in 2017 which was normal -report was reviewed and did show ileitis.  Done in [place] ?  ?US abdomen 03/09/19 - IMPRESSION: ?No acute sonographic abnormality detected. No specific abnormality ?detected to explain the patient's abdominal pain. ?  ?Prior records reviewed from other centers / countries: ?  ?  3 upper endoscopies between Aug 2017 and Oct 2018 - reportedly showed H pylori gastritis, has been treated 3 times for H pylori with eradication noted on 2 tests in 2019 ?  ?EGD 03/05/2017 - H pylori gastritis, small bowel biopsies normal ?  ?Esophageal manometry 11/12/2017 - normal ?24 hour pH test done OFF PPI - normal ?  ?US kidneys 12/09/2017 - normal ?  ?H pylori stool antigen test 03/12/2018 - negative ?  ?H pylori breath test 05/28/2018 - negative ?  ?Fecal calprotectin 07/01/2018 - 62 ?  ?RUQ [place] 07/2018 - tiny 53mm gallbladder polyp, otherwise normal ?  ?Other MRIs / CT scans on disk I cannot open/ see -  she was told they were normal ?  ?  ?EGD 09/13/21 -  ?- The exam of the esophagus was otherwise normal. ?- Patchy inflammation characterized by erosions was found in the gastric fundus, in the gastric ?body and in the gastric antrum. ?- The exam of the stomach was otherwise normal. ?- Biopsies were taken with a cold forceps in the gastric body, at the incisura and in the gastric ?antrum for Helicobacter pylori testing and histology. ?- The duodenal bulb and second portion of the duodenum were normal. Biopsies for histology ?were taken with a cold forceps for evaluation of celiac disease. ?  ?  ?1. Surgical [P], duodenal bulb and 2nd portion of duodenum ?- BENIGN DUODENAL MUCOSA ?- NO ACUTE INFLAMMATION, VILLOUS BLUNTING OR INCREASED INTRAEPITHELIAL LYMPHOCYTES IDENTIFIED ?2. Surgical [P], gastric antrum and gastric body ?- MILD CHRONIC GASTRITIS WITH REACTIVE CHANGES ?- NO H. PYLORI OR INTESTINAL METAPLASIA IDENTIFIED ?- SEE COMMENT ?  ?  ?Colonoscopy 11/06/21: ?The perianal and digital rectal examinations were normal. ?- Mildly erythematous mucosa in the terminal ileum without focal ulceration. Biopsies were ?taken with a cold forceps for histology. ?- A few small-mouthed diverticula were found in the ascending colon. ?- A diminutive polyp was found in the transverse colon. The polyp was sessile. The polyp ?was removed with a cold biopsy forceps. Resection and retrieval were complete. ?- A 4 mm polyp was found in the recto-sigmoid colon. The polyp was sessile. The polyp was ?removed with a cold snare. Resection and retrieval were complete. ?- The exam was otherwise without abnormality. No colonic inflammation. ?  ?1. Surgical [P], colon, ileal biopsies ?- SUPERFICIAL FRAGMENTS OF ILEAL MUCOSA WITH MIXED ACUTE AND CHRONIC INFLAMMATION ?- NO GRANULOMAS, DYSPLASIA OR MALIGNANCY IDENTIFIED ?- SEE COMMENT ?2. Surgical [P], colon, transverse, polyp (1) ?- SESSILE SERRATED POLYP (1 OF 2 FRAGMENTS) ?- BENIGN COLONIC MUCOSA  (1 OF 2 FRAGMENTS) ?- NO HIGH-GRADE DYSPLASIA OR MALIGNANCY IDENTIFIED ?3. Surgical [P], colon, recto-sigmoid, polyp (1) ?- TUBULAR ADENOMA (1 OF 1 FRAGMENTS) ?- NO HIGH-GRADE DYSPLASIA OR MALIGNANCY IDENTIFIED ?Microscopic Comment ?1. Definitive features of chronicity are not identified. The differential diagnosis includes ischemia, NSAIDs and ?inflammatory bowel disease. Clinical correlation is recommended. ?  ?  ?CT enterography 11/15/2021: ?IMPRESSION: ?1. Fibrofatty infiltration of the terminal/distal ileum with ?submucosal hyperenhancement. Findings can be seen in the setting of ?inflammatory bowel disease such as Crohn's disease. ?2. No other areas of bowel inflammation identified ?  ? ?Fecal calprotectin 153 down to 89 on last check 12/11/21 ?  ?Labs 12/26/21: ?Vit D okay ?B12 okay ?Iron normal ?Hgb normal at 12.2 ? ? ?Past Medical History:  ?Diagnosis Date  ? Anxiety   ? 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 09-13-21--pt states burning pain of tongue since covid  ? Ear  pain   ? right some times in the left   ? Gastritis   ? IBS (irritable bowel syndrome)   ? Ileitis   ? Vertigo 07/19/2019  ? ? ? ?Past Surgical History:  ?Procedure Laterality Date  ? CESAREAN SECTION    ? UPPER GASTROINTESTINAL ENDOSCOPY  09/13/2021  ? 2019-[place], 2017 had 2  ? ?Family History  ?Problem Relation Age of Onset  ? Healthy Mother   ? Healthy Father   ? Colon cancer Neg Hx   ? Colon polyps Neg Hx   ? Esophageal cancer Neg Hx   ? Rectal cancer Neg Hx   ? Stomach cancer Neg Hx   ? ?Social History  ? ?Tobacco Use  ? Smoking status: Never  ? Smokeless tobacco: Never  ?Vaping Use  ? Vaping Use: Never used  ?Substance Use Topics  ? Alcohol use: Not Currently  ?  Comment: wine occasionally couple times a year  ? Drug use: Never  ? ?Current Outpatient Medications  ?Medication Sig Dispense Refill  ? clonazePAM (KLONOPIN) 0.5 MG tablet Take 0.5 tablets (0.25 mg total) by mouth in the morning, at noon, and  at bedtime. 30 tablet   ? escitalopram (LEXAPRO) 10 MG tablet Take 10 mg by mouth daily.    ? gabapentin (NEURONTIN) 100 MG capsule Take 2 capsules by mouth 2 (two) times daily.    ? lidocaine Tillie Fantasia

## 2022-01-29 ENCOUNTER — Encounter: Payer: Self-pay | Admitting: Gastroenterology

## 2022-01-30 ENCOUNTER — Encounter: Payer: Self-pay | Admitting: Gastroenterology

## 2022-01-31 ENCOUNTER — Encounter: Payer: Self-pay | Admitting: Gastroenterology

## 2022-02-02 ENCOUNTER — Encounter: Payer: Self-pay | Admitting: Gastroenterology

## 2022-02-16 ENCOUNTER — Encounter: Payer: Self-pay | Admitting: Gastroenterology

## 2022-02-18 ENCOUNTER — Encounter: Payer: Self-pay | Admitting: Gastroenterology

## 2022-02-19 ENCOUNTER — Encounter: Payer: Self-pay | Admitting: Gastroenterology

## 2022-02-19 ENCOUNTER — Ambulatory Visit: Payer: BC Managed Care – PPO | Admitting: Orthopaedic Surgery

## 2022-02-23 ENCOUNTER — Encounter: Payer: Self-pay | Admitting: Family Medicine

## 2022-02-27 ENCOUNTER — Encounter: Payer: Self-pay | Admitting: Gastroenterology

## 2022-03-09 ENCOUNTER — Encounter: Payer: Self-pay | Admitting: Gastroenterology

## 2022-03-09 ENCOUNTER — Encounter (INDEPENDENT_AMBULATORY_CARE_PROVIDER_SITE_OTHER): Payer: BC Managed Care – PPO | Admitting: Gastroenterology

## 2022-03-09 DIAGNOSIS — R140 Abdominal distension (gaseous): Secondary | ICD-10-CM

## 2022-03-09 DIAGNOSIS — R110 Nausea: Secondary | ICD-10-CM

## 2022-03-10 ENCOUNTER — Encounter: Payer: Self-pay | Admitting: Gastroenterology

## 2022-03-10 NOTE — Telephone Encounter (Signed)
Please see the MyChart message reply(ies) for my assessment and plan.  ?  ?This patient gave consent for this Medical Advice Message and is aware that it may result in a bill to their insurance company, as well as the possibility of receiving a bill for a co-payment or deductible. They are an established patient, but are not seeking medical advice exclusively about a problem treated during an in person or video visit in the last seven days. I did not recommend an in person or video visit within seven days of my reply.  ?  ?I spent a total of 8 minutes cumulative time within 7 days through MyChart messaging. ? ?Salma Walrond P Janiel Derhammer, MD   ?

## 2022-03-19 ENCOUNTER — Encounter: Payer: Self-pay | Admitting: Gastroenterology

## 2022-03-19 ENCOUNTER — Telehealth: Payer: BC Managed Care – PPO | Admitting: Gastroenterology

## 2022-03-25 ENCOUNTER — Encounter: Payer: Self-pay | Admitting: Gastroenterology

## 2022-03-26 ENCOUNTER — Encounter: Payer: Self-pay | Admitting: Gastroenterology

## 2022-03-28 ENCOUNTER — Telehealth (INDEPENDENT_AMBULATORY_CARE_PROVIDER_SITE_OTHER): Payer: BC Managed Care – PPO | Admitting: Gastroenterology

## 2022-03-28 ENCOUNTER — Encounter: Payer: Self-pay | Admitting: Gastroenterology

## 2022-03-28 DIAGNOSIS — R110 Nausea: Secondary | ICD-10-CM | POA: Diagnosis not present

## 2022-03-28 DIAGNOSIS — F411 Generalized anxiety disorder: Secondary | ICD-10-CM

## 2022-03-28 DIAGNOSIS — R140 Abdominal distension (gaseous): Secondary | ICD-10-CM | POA: Diagnosis not present

## 2022-03-28 DIAGNOSIS — K529 Noninfective gastroenteritis and colitis, unspecified: Secondary | ICD-10-CM

## 2022-03-28 NOTE — Progress Notes (Signed)
HPI :  THIS ENCOUNTER IS A VIRTUAL VISIT USING VIDEO ENABLED CHAT THROUGH Epic. PATIENT WAS NOT SEEN IN THE OFFICE. PATIENT HAS CONSENTED TO VIRTUAL VISIT / TELEMEDICINE VISIT   Location of patient: parent is in her family's house in [place] Location of provider: office Persons participating: myself, patient, family in background Time spent on call:  26 minutes   HPI :  51 year old female who requested of virtual visit today while she is in [place], accompanied by her family.  Please see prior notes for her extensive history regarding her symptoms of nausea, bloating.  Recall had a relatively recent colonoscopy and CT scan enterography which showed some mild ileitis.  She has had a fecal calprotectin of the 150s and mild iron deficiency anemia.  She has had severe anxiety associated with this, her symptoms have been challenging to treat. Recall she has had the symptoms in the past and an extensive evaluation in multiple locations internationally over recent years, in [place], and [place] as outlined below.  She had a colonoscopy in 2017 which did show some ileitis.   In recent months recall I gave her a course of budesonide 9 mg daily for 4 weeks with a slow taper.  She is also been on pantoprazole for reflux, Phenergan for nausea, Remeron and Klonopin for anxiety and appetite.  She is also had a mild iron deficiency anemia which resolved with IV iron. ESR and CRP have been pretty normal, she had a follow-up fecal calprotectin with budesonide that dropped to 89.   She was feeling better at her last visit a few months ago.  She had had persistent bloating which bothered her and we gave her a course of rifaximin trial [place].  She states this definitely helped her bloating and she was actually feeling really well while on the regimen.  She completed the course last week, within a day she states she has had nausea that has bothered her as well as  burping/belching.  Her bloating is not as bad, is intermittent recently.  She initially had 1 day of loose stools and then it resolved.  She is not seeing any blood in her stools.  Prior to the symptoms occurring she had also tried reducing her Remeron to half the dose, down to 7.5 mg/day.  She had tapered off the Protonix as well and was not taking it.  She has since returned to taking Protonix 20 mg daily and Remeron and is now back at 15 mg daily.  She does not take Zofran as she does not think it helps her nausea.  She has been taking Phenergan 12.5 mg, few doses, is not sure how much it really helped yet.  She is not vomiting.  She denies any abdominal pains.  No fevers.  No sick contacts.  She has been compliant with her Lexapro and Klonopin for her anxiety.  She has questions about what is causing her symptoms, as his Crohn's disease, how we can treat it moving forward.  She has seen some local physicians where she is staying, she has not established with a GI physician there.  She is not sure how long she will be in [place] at this point time. We reviewed her course at length.   Prior workup: H  Breath test negative 02/07/19   She has had a colonoscopy in 2017 - report was reviewed and did show ileitis.  Done in [place]   US  abdomen 03/09/19 - IMPRESSION: No acute sonographic abnormality detected. No specific abnormality detected to explain the patient's abdominal pain.   Prior records reviewed from other centers / countries:   3 upper endoscopies between Aug 2017 and Oct 2018 - reportedly showed H pylori gastritis, has been treated 3 times for H pylori with eradication noted on 2 tests in 2019   EGD 03/05/2017 - H pylori gastritis, small bowel biopsies normal   Esophageal manometry 11/12/2017 - normal 24 hour pH test done OFF PPI - normal   US kidneys 12/09/2017 - normal   H pylori stool antigen test 03/12/2018 - negative   H pylori breath test 05/28/2018 - negative   Fecal  calprotectin 07/01/2018 - 62   RUQ [place] 07/2018 - tiny 54m gallbladder polyp, otherwise normal   Other MRIs / CT scans on disk I cannot open/ see - she was told they were normal     EGD 09/13/21 -  - The exam of the esophagus was otherwise normal. - Patchy inflammation characterized by erosions was found in the gastric fundus, in the gastric body and in the gastric antrum. - The exam of the stomach was otherwise normal. - Biopsies were taken with a cold forceps in the gastric body, at the incisura and in the gastric antrum for Helicobacter pylori testing and histology. - The duodenal bulb and second portion of the duodenum were normal. Biopsies for histology were taken with a cold forceps for evaluation of celiac disease.     1. Surgical [P], duodenal bulb and 2nd portion of duodenum - BENIGN DUODENAL MUCOSA - NO ACUTE INFLAMMATION, VILLOUS BLUNTING OR INCREASED INTRAEPITHELIAL LYMPHOCYTES IDENTIFIED 2. Surgical [P], gastric antrum and gastric body - MILD CHRONIC GASTRITIS WITH REACTIVE CHANGES - NO H. PYLORI OR INTESTINAL METAPLASIA IDENTIFIED - SEE COMMENT     Colonoscopy 11/06/21: The perianal and digital rectal examinations were normal. - Mildly erythematous mucosa in the terminal ileum without focal ulceration. Biopsies were taken with a cold forceps for histology. - A few small-mouthed diverticula were found in the ascending colon. - A diminutive polyp was found in the transverse colon. The polyp was sessile. The polyp was removed with a cold biopsy forceps. Resection and retrieval were complete. - A 4 mm polyp was found in the recto-sigmoid colon. The polyp was sessile. The polyp was removed with a cold snare. Resection and retrieval were complete. - The exam was otherwise without abnormality. No colonic inflammation.   1. Surgical [P], colon, ileal biopsies - SUPERFICIAL FRAGMENTS OF ILEAL MUCOSA WITH MIXED ACUTE AND CHRONIC INFLAMMATION - NO GRANULOMAS, DYSPLASIA OR  MALIGNANCY IDENTIFIED - SEE COMMENT 2. Surgical [P], colon, transverse, polyp (1) - SESSILE SERRATED POLYP (1 OF 2 FRAGMENTS) - BENIGN COLONIC MUCOSA (1 OF 2 FRAGMENTS) - NO HIGH-GRADE DYSPLASIA OR MALIGNANCY IDENTIFIED 3. Surgical [P], colon, recto-sigmoid, polyp (1) - TUBULAR ADENOMA (1 OF 1 FRAGMENTS) - NO HIGH-GRADE DYSPLASIA OR MALIGNANCY IDENTIFIED  1. Definitive features of chronicity are not identified. The differential diagnosis includes ischemia, NSAIDs and inflammatory bowel disease. Clinical correlation is recommended.     CT enterography 11/15/2021: IMPRESSION: 1. Fibrofatty infiltration of the terminal/distal ileum with submucosal hyperenhancement. Findings can be seen in the setting of inflammatory bowel disease such as Crohn's disease. 2. 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 05/2020   09-13-21--pt states burning pain of tongue since covid   Ear pain    right some times in the left    Gastritis    IBS (irritable bowel syndrome)    Ileitis    Vertigo 07/19/2019     Past Surgical History:  Procedure Laterality Date   CESAREAN SECTION     UPPER GASTROINTESTINAL ENDOSCOPY  09/13/2021   2019-[place], 2017 had 2   Family History  Problem Relation Age of Onset   Healthy Mother    Healthy Father    Colon cancer Neg Hx    Colon polyps Neg Hx    Esophageal cancer Neg Hx    Rectal cancer Neg Hx    Stomach cancer Neg Hx    Social History   Tobacco Use   Smoking status: Never   Smokeless tobacco: Never  Vaping Use   Vaping Use: Never used  Substance Use Topics   Alcohol use: Not Currently    Comment: wine occasionally couple times a year   Drug use: Never   Current Outpatient Medications   Medication Sig Dispense Refill   clonazePAM (KLONOPIN) 0.5 MG tablet Take 0.5 tablets (0.25 mg total) by mouth in the morning, at noon, and at bedtime. 30 tablet    escitalopram (LEXAPRO) 10 MG tablet Take 10 mg by mouth daily.     gabapentin (NEURONTIN) 100 MG capsule Take 2 capsules by mouth 2 (two) times daily.     lidocaine (XYLOCAINE) 5 % ointment Apply 1 application topically 4 (four) times daily as needed. 30 g 0   mirtazapine (REMERON) 15 MG tablet Take 15 mg by mouth at bedtime.     pantoprazole (PROTONIX) 40 MG tablet Take 1 tablet (40 mg total) by mouth daily. 90 tablet 1   promethazine (PHENERGAN) 12.5 MG tablet Take 1-2 tablets (12.5-25 mg total) by mouth every 6 (six) hours as needed. 30 tablet 0   Vitamin D, Ergocalciferol, (DRISDOL) 1.25 MG (50000 UNIT) CAPS capsule Take 50,000 Units by mouth once a week.     No current facility-administered medications for this visit.   Facility-Administered Medications Ordered in Other Visits  Medication Dose Route Frequency Provider Last Rate Last Admin   albuterol (VENTOLIN HFA) 108 (90 Base) MCG/ACT inhaler 2 puff  2 puff Inhalation Once PRN Cozette Braggs, Carlota Raspberry, MD       diphenhydrAMINE (BENADRYL) injection 50 mg  50 mg Intravenous Once PRN Rabecka Brendel, Carlota Raspberry, MD       EPINEPHrine (EPI-PEN) injection 0.3 mg  0.3 mg Intramuscular Once PRN Tomoki Lucken, Carlota Raspberry, MD       famotidine (PEPCID) IVPB 20 mg premix  20 mg Intravenous Once PRN Rheannon Cerney, Carlota Raspberry, MD       methylPREDNISolone sodium succinate (SOLU-MEDROL) 125 mg/2 mL injection 125 mg  125 mg Intravenous Once PRN Rilley Stash, Carlota Raspberry, MD       No Known Allergies   Review of Systems: All systems reviewed and negative except where noted in HPI.   Lab Results  Component Value Date   WBC 6.0 12/26/2021   HGB 12.2 12/26/2021   HCT 35.9 (L) 12/26/2021   MCV 94.6 12/26/2021   PLT 280.0 12/26/2021    Lab Results  Component Value Date   CREATININE 0.69 11/06/2021   BUN 8  11/06/2021   NA 140 11/06/2021   K 3.7 11/06/2021  CL 107 11/06/2021   CO2 23 11/06/2021    Lab Results  Component Value Date   ALT 13 11/06/2021   AST 12 11/06/2021   ALKPHOS 52 11/06/2021   BILITOT 0.5 11/06/2021    Lab Results  Component Value Date   IRON 105 12/26/2021   TIBC 282.8 12/26/2021   FERRITIN 190.6 12/26/2021     Physical Exam: There were no vitals taken for this visit. Constitutional: Pleasant,well-developed, female in no acute distress.   ASSESSMENT AND PLAN: 51 year old female here for reassessment of the following:   Ileitis Nausea Bloating Anxiety   Remote visit done today with the patient as she remains in [place] with her family. See extensive notes from prior visits in the past for full details.  She has had objective evidence of mild ileitis on colonoscopy and enterography study along with elevated fecal calprotectin and iron deficiency, raising concern for mild Crohn's disease.  Apparently she had some ileitis on colonoscopy dating back to 2017 as well.  We discussed that underlying Crohn's disease is quite possible however her symptoms have been out of proportion to objective findings over the past several months.  A course of budesonide has decreased her fecal calprotectin and she was feeling better the last time we spoke. She has had significant anxiety which has affected her ability to cope with the symptoms.  That has been much better controlled with the use of Remeron to help her appetite and use of Klonopin.  At her last visit she was feeling better on her regimen while being off budesonide and not on any therapy for ileitis.  She tapered down her Protonix and stopped it for a period of time.  She more recently had taper down off the Remeron as well.  She had a trial of rifaximin that she brought with her for severe bloating.  She had some recurrent bloating and took the rifaximin course and states she felt much better while she was on it.  She  stopped this last week after she completed her course and subsequently developed some recurrent nausea and 1 day worth of loose stools.  Loose stools have stopped but she has had persistent nausea for a few days now along with belching and intermittent bloating.  She has been offered domperidone locally, told her some of the risks associated with that, I do not think she has gastroparesis, and not normally something I would use acutely but she can discuss that with the physicians in [place] who have offered her that in relation to other options that they may have for her.  We discussed her acute symptoms and more chronic symptoms.  The question at this time is if she has recurrent small bowel inflammation/Crohn's disease that is driving this or not.  If she was in [place] now I would recommend a fecal calprotectin and a capsule endoscopy and/or repeated enterography study.  She is not sure when she is coming back here, could be in [place] for another few months.  In that light I recommend that she see a local GI physician there in [place], bring all of her records with her there from her MyChart account, and see if she can get evaluated there for some of the symptoms.  We discussed that for recurrent bloating we could give her another course of rifaximin but I cannot get that to her in [place].  I do think she should increase her Protonix to 20 mg twice daily at this time, she has responded  to higher dosing in the past.  She should stay on her 15 mg of Remeron nightly, this can also help nausea and help her cope with her symptoms.  She can increase her Phenergan to 25 mg dosing if that helps.  She does not take Zofran, that has not helped her.  She agrees with the plan.  She will keep me posted on her progress in [place] and let me know [place], she will try to establish with a GI locally in [place] while she is still there if she does not foresee herself returning to [place] in  the near future.    Plan: - increase protonix to 63m twice daily - keep Remeron at 139mdaily, and continue Lexapro / Klonopin for her anxiety - can use phenergan 12.52m40mo 252m31mday - would consider another course of Rifaximin for recurrent bloating - as above, recommend fecal calprotectin and capsule endoscopy [place] going to stay [place] she should establish with a local GI physician there who can review her records and further evaluate her small bowel.  - she can keep me posted via Mychart and I can answer questions as best I can until [place] 35 minutes of time, including in depth chart review, face-to-face time with the patient, and documentation of this encounter.   StevJolly Mango [facility]

## 2022-04-06 ENCOUNTER — Encounter: Payer: Self-pay | Admitting: Gastroenterology

## 2022-04-11 ENCOUNTER — Encounter: Payer: Self-pay | Admitting: Gastroenterology

## 2022-04-28 ENCOUNTER — Encounter: Payer: Self-pay | Admitting: Gastroenterology

## 2022-04-29 ENCOUNTER — Encounter: Payer: Self-pay | Admitting: Gastroenterology

## 2022-05-03 ENCOUNTER — Encounter: Payer: Self-pay | Admitting: Gastroenterology

## 2022-05-23 ENCOUNTER — Encounter: Payer: Self-pay | Admitting: Obstetrics and Gynecology

## 2022-05-24 ENCOUNTER — Encounter: Payer: Self-pay | Admitting: Obstetrics and Gynecology

## 2022-05-26 NOTE — Telephone Encounter (Signed)
Dr. Talbert Nan she sent 2 other message. I attached to this because has results attached to this my chart message.    Doc Sharee Pimple I also had CT abdomen in [place] in month of January this year the result is on the [facility].    I got blood work done last week here in [place] and [place] were fine.  I have lost weight due to my ongoing stomach issues I experience lot of bloating , nausea since 9 months. But past 10 days there is a dull pain like a tight band around the abdomen this is a new symptom I never had pain before. Sometimes it keeps me up at night although I take clonozapam for sleep past 9 months  & am on pantaprazole past 9 months. . And now this spotting happened for 2 days.    Do you recommend a sonography ot ultrasound. What would your advice be.    Regards  Chelsea Bradshaw     Doc Sharee Pimple I forgot to tell you had given me some Medicine I don't remember now what it was but you said to take it if needed.  I did not take that medicine as I was on lot of meds for my stomach . I think it was to regulate the period. Am not sure now what it was exactly. You had asked me to record start and end of period date which I was doing.  Regards Chelsea Bradshaw

## 2022-05-26 NOTE — Telephone Encounter (Signed)
Attached this message with another message sent.

## 2022-05-28 ENCOUNTER — Encounter: Payer: Self-pay | Admitting: Obstetrics and Gynecology

## 2022-05-28 ENCOUNTER — Encounter: Payer: Self-pay | Admitting: Gastroenterology

## 2022-05-29 ENCOUNTER — Encounter: Payer: Self-pay | Admitting: Obstetrics and Gynecology

## 2022-06-01 ENCOUNTER — Encounter: Payer: Self-pay | Admitting: Obstetrics and Gynecology

## 2022-06-02 ENCOUNTER — Encounter: Payer: Self-pay | Admitting: Obstetrics and Gynecology

## 2022-06-05 ENCOUNTER — Encounter: Payer: Self-pay | Admitting: Obstetrics and Gynecology

## 2022-06-05 ENCOUNTER — Encounter: Payer: Self-pay | Admitting: Gastroenterology

## 2022-06-10 ENCOUNTER — Encounter: Payer: Self-pay | Admitting: Obstetrics and Gynecology

## 2022-06-10 NOTE — Telephone Encounter (Signed)
Can you get her on my schedule for a virtual visit today please. I've sent her a message to call first thing and speak with triage.

## 2022-06-11 ENCOUNTER — Encounter: Payer: Self-pay | Admitting: Obstetrics and Gynecology

## 2022-06-11 ENCOUNTER — Telehealth (INDEPENDENT_AMBULATORY_CARE_PROVIDER_SITE_OTHER): Payer: BC Managed Care – PPO | Admitting: Obstetrics and Gynecology

## 2022-06-11 DIAGNOSIS — N951 Menopausal and female climacteric states: Secondary | ICD-10-CM

## 2022-06-11 DIAGNOSIS — N939 Abnormal uterine and vaginal bleeding, unspecified: Secondary | ICD-10-CM | POA: Diagnosis not present

## 2022-06-11 MED ORDER — MEDROXYPROGESTERONE ACETATE 5 MG PO TABS: ORAL_TABLET | ORAL | 1 refills | Status: AC

## 2022-06-11 NOTE — Telephone Encounter (Signed)
Patient scheduled today at 10:00am.

## 2022-06-11 NOTE — Progress Notes (Signed)
Virtual Visit via Video Note  I connected with Chelsea Bradshaw on 06/11/22 at 10:00 AM EDT by a video enabled telemedicine application and verified that I am speaking with the correct person using two identifiers.  Location: Patient: at her parents home in [place] Provider: [place] office   I discussed the limitations of evaluation and management by telemedicine and the availability of in person appointments. The patient expressed understanding and agreed to proceed.  GYNECOLOGY  VISIT   HPI: 51 y.o.   Married Unavailable Unavailable  female   [phone] with No LMP recorded. (Menstrual status: Irregular Periods).   here to discuss AUB and her care in [place]. Her LMP was in 11/22, she then started bleeding on 05/23/22. She was seen by a provider in [place]. Labs, u/s and surgery report have been reviewed.  TSH 0.63 AMH 0.015 FSH 59.13 U/S stripe of 10 mm, few fibroids, normal left ovary, right ovary with 1.1 x 1.1 cm cyst. She had a D&C pathology returned with atrophy, few secretory glands. The MD in [place] prescribed Regesterone CR for 15 days. This is Norethisterone, a progesterone.   She has also been having bloating and is seeing a Homeopath, they have given her medication that is helping some.   GYNECOLOGIC HISTORY: No LMP recorded. (Menstrual status: Irregular Periods). 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   Vertigo 07/19/2019   Iron deficiency anemia 07/12/2019   Vitamin D deficiency 07/12/2019   Throat pain 06/22/2019   Chronic right ear pain 03/28/2019   Anxiety about health  02/18/2019   Irritable bowel syndrome 02/18/2019   GAD (generalized anxiety disorder) 01/31/2019   Bilateral temporomandibular joint pain 01/10/2019   Dysfunction of right eustachian tube 01/10/2019    Past Medical History:  Diagnosis Date   Anxiety    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 30 tablet    escitalopram (LEXAPRO) 10 MG tablet Take 10 mg by mouth daily.     gabapentin (NEURONTIN) 100 MG capsule Take 2 capsules by mouth 2 (two) times daily.     lidocaine (XYLOCAINE) 5 % ointment Apply 1 application topically 4 (four) times daily as needed. [place]  g 0   mirtazapine (REMERON) 15 MG tablet Take 15 mg by mouth at bedtime.     pantoprazole (PROTONIX) 40 MG tablet Take 1 tablet (40 mg total) by mouth daily. 90 tablet 1   promethazine (PHENERGAN) 12.5 MG tablet Take 1-2 tablets (12.5-25 mg total) by mouth every 6 (six) hours as needed. 30 tablet 0   Vitamin D, Ergocalciferol, (DRISDOL) 1.25 MG (50000 UNIT) CAPS capsule Take 50,000 Units by mouth once a week.     No current facility-administered medications for this visit.   Facility-Administered Medications Ordered in Other Visits  Medication Dose Route Frequency Provider Last Rate Last Admin   albuterol (VENTOLIN HFA) 108 (90 Base) MCG/ACT inhaler 2 puff  2 puff Inhalation Once PRN Armbruster, Carlota Raspberry, MD       diphenhydrAMINE (BENADRYL) injection 50 mg  50 mg Intravenous Once PRN  Armbruster, Carlota Raspberry, MD       EPINEPHrine (EPI-PEN) injection 0.3 mg  0.3 mg Intramuscular Once PRN Armbruster, Carlota Raspberry, MD       famotidine (PEPCID) IVPB 20 mg premix  20 mg Intravenous Once PRN Armbruster, Carlota Raspberry, MD       methylPREDNISolone sodium succinate (SOLU-MEDROL) 125 mg/2 mL injection 125 mg  125 mg Intravenous Once PRN Armbruster, Carlota Raspberry, MD         ALLERGIES: Patient has no known allergies.  Family History  Problem Relation Age of Onset   Healthy Mother    Healthy Father    Colon cancer Neg Hx    Colon polyps Neg Hx    Esophageal cancer Neg Hx    Rectal cancer Neg Hx    Stomach cancer Neg Hx     Social History   Socioeconomic History   Marital status: Married    Spouse name: Not on file   Number of children: 1   Years of education: Not on file   Highest education level: Not on file  Occupational History   Not on file  Tobacco Use   Smoking status: Never   Smokeless tobacco: Never  Vaping Use   Vaping Use: Never used  Substance and Sexual Activity   Alcohol use: Not Currently    Comment: wine occasionally couple times a year   Drug use: Never   Sexual activity: Not Currently  Other Topics Concern   Not on file  Social History Narrative   Right handed    Caffeine 2 cups per day.    Social Determinants of Health   Financial Resource Strain: Not on file  Food Insecurity: Not on file  Transportation Needs: Not on file  Physical Activity: Not on file  Stress: Not on file  Social Connections: Not on file  Intimate Partner Violence: Not on file    ROS  PHYSICAL EXAMINATION:    There were no vitals taken for this visit.    General appearance: alert, cooperative and appears stated age  76. Abnormal uterine bleeding I discussed with the patient all of her labs, ultrasound and pathology report. I reviewed perimenopause, anovulatory bleeding and discussed cyclic progesterone to help prevent future problems. -A diagram about the normal cycle and  anovulation was reviewed. - medroxyPROGESTERone (PROVERA) 5 MG tablet; Take one tablet a day for 5 days every other month until you go 6 months without any bleeding  Dispense: 15 tablet; Refill: 1 -Her husband is traveling to [place] and will take the script to her. She isn't sure when she is returning  2. Perimenopausal -  medroxyPROGESTERone (PROVERA) 5 MG tablet; Take one tablet a day for 5 days every other month until you go 6 months without any bleeding  Dispense: 15 tablet; Refill: 1

## 2022-06-12 ENCOUNTER — Encounter: Payer: Self-pay | Admitting: Obstetrics and Gynecology

## 2022-06-13 ENCOUNTER — Encounter: Payer: Self-pay | Admitting: Obstetrics and Gynecology

## 2022-06-28 ENCOUNTER — Encounter: Payer: Self-pay | Admitting: Obstetrics and Gynecology

## 2022-06-29 ENCOUNTER — Encounter: Payer: Self-pay | Admitting: Obstetrics and Gynecology

## 2022-07-01 ENCOUNTER — Encounter: Payer: Self-pay | Admitting: Obstetrics and Gynecology

## 2022-07-01 NOTE — Telephone Encounter (Signed)
Message sent to appointment desk to assist patient with scheduling video visit.

## 2022-07-03 ENCOUNTER — Other Ambulatory Visit: Payer: Self-pay | Admitting: Obstetrics and Gynecology

## 2022-07-03 DIAGNOSIS — N951 Menopausal and female climacteric states: Secondary | ICD-10-CM

## 2022-07-03 DIAGNOSIS — N939 Abnormal uterine and vaginal bleeding, unspecified: Secondary | ICD-10-CM

## 2022-07-03 NOTE — Telephone Encounter (Signed)
Pt should not need refill this soon right? Please advise.

## 2022-07-03 NOTE — Telephone Encounter (Signed)
The script and refill that was sent last month should last a year. She doesn't need a refill.

## 2022-07-14 ENCOUNTER — Telehealth (INDEPENDENT_AMBULATORY_CARE_PROVIDER_SITE_OTHER): Payer: BC Managed Care – PPO | Admitting: Obstetrics and Gynecology

## 2022-07-14 ENCOUNTER — Encounter: Payer: Self-pay | Admitting: Obstetrics and Gynecology

## 2022-07-14 NOTE — Progress Notes (Signed)
Patient not seen.

## 2022-07-14 NOTE — Telephone Encounter (Signed)
Appointments per JJ "Please find another spot for her for a virtual visit. How about 9:45 on Wednesday? Thanks

## 2022-07-17 NOTE — Progress Notes (Signed)
Virtual Visit via Video Note  I connected with [place] on 07/14/22 at 10:00 AM EDT by a video enabled telemedicine application and verified that I am speaking with the correct person using two identifiers.  Location: Patient: [place], in her parents home Provider: Office at [facility]   I discussed the limitations of evaluation and management by telemedicine and the availability of in person appointments. The patient expressed understanding and agreed to proceed.  GYNECOLOGY  VISIT   HPI: 51 y.o.   Married Unavailable Unavailable  female   [phone] with No LMP recorded. (Menstrual status: Irregular Periods).   here for  further discussion of perimenopause.  She has been in [place] for many months and trying to coordinate care between [place] and here. She was seen by a physician in [place] for perimenopausal bleeding.    TSH 0.63 AMH 0.015 FSH 59.13 U/S stripe of 10 mm, few fibroids, normal left ovary, right ovary with 1.1 x 1.1 cm cyst. She had a D&C pathology returned with atrophy, few secretory glands. The MD in [place] prescribed Regesterone CR for 15 days.   During a virtual visit with me last month, I discussed with the patient all of her labs, ultrasound and pathology report. I reviewed perimenopause, anovulatory bleeding and discussed cyclic progesterone to help prevent future problems. -A diagram about the normal cycle and anovulation was reviewed. - medroxyPROGESTERone (PROVERA) 5 MG tablet; Take one tablet a day for 5 days every other month until you go 6 months without any bleeding  Dispense: 15 tablet; Refill: 1  She took the Regesterone as prescribed by her provider in [place] and had a w/d bleed. That bleed lasted for 5-6 days.  She has questions about how to take the provera and what to do if she has bleeding in between taking the provera.  She also had questions about vitamins, supplements.    GYNECOLOGIC HISTORY: No LMP recorded. (Menstrual status: Irregular Periods).         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   Vertigo 07/19/2019   Iron deficiency anemia 07/12/2019   Vitamin D deficiency 07/12/2019   Throat pain 06/22/2019   Chronic right ear pain 03/28/2019   Anxiety about health 02/18/2019   Irritable bowel syndrome 02/18/2019   GAD (generalized anxiety disorder) 01/31/2019   Bilateral temporomandibular joint pain 01/10/2019   Dysfunction of right eustachian tube 01/10/2019    Past Medical History:  Diagnosis Date   Anxiety    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 30 tablet    escitalopram (LEXAPRO) 10 MG tablet Take 10 mg by mouth daily.     gabapentin (NEURONTIN) 100 MG capsule Take 2 capsules by mouth 2 (two) times daily.     lidocaine (XYLOCAINE) 5 % ointment Apply 1 application topically 4 (four) times daily as needed. 30 g 0   medroxyPROGESTERone (PROVERA) 5 MG tablet Take one tablet a day for 5 days every other month until you go 6 months without any bleeding 15 tablet 1   mirtazapine (REMERON) 15 MG tablet Take 15 mg by mouth at bedtime.      pantoprazole (PROTONIX) 40 MG tablet Take 1 tablet (40 mg total) by mouth daily. 90 tablet 1   promethazine (PHENERGAN) 12.5 MG tablet Take 1-2 tablets (12.5-25 mg total) by mouth every 6 (six) hours as needed. 30 tablet 0   Vitamin D, Ergocalciferol, (DRISDOL) 1.25 MG (50000 UNIT) CAPS capsule Take 50,000 Units by mouth once a week.     No current facility-administered medications for this visit.   Facility-Administered Medications Ordered in Other Visits  Medication Dose Route Frequency Provider Last Rate Last Admin   albuterol (VENTOLIN HFA) 108 (90 Base) MCG/ACT inhaler 2 puff  2 puff Inhalation Once PRN Armbruster, Carlota Raspberry, MD       diphenhydrAMINE (BENADRYL) injection 50 mg  50 mg Intravenous Once PRN Armbruster, Carlota Raspberry, MD       EPINEPHrine (EPI-PEN) injection 0.3 mg  0.3 mg Intramuscular Once PRN Armbruster, Carlota Raspberry, MD       famotidine (PEPCID) IVPB 20 mg premix  20 mg Intravenous Once PRN Armbruster, Carlota Raspberry, MD       methylPREDNISolone sodium succinate (SOLU-MEDROL) 125 mg/2 mL injection 125 mg  125 mg Intravenous Once PRN Armbruster, Carlota Raspberry, MD         ALLERGIES: Patient has no known allergies.  Family History  Problem Relation Age of Onset   Healthy Mother    Healthy Father    Colon cancer Neg Hx    Colon polyps Neg Hx    Esophageal cancer Neg Hx    Rectal cancer Neg Hx    Stomach cancer Neg Hx     Social History   Socioeconomic History   Marital status: Married    Spouse name: Not on file   Number of children: 1   Years of education: Not on file   Highest education level: Not on file  Occupational History   Not on file  Tobacco Use   Smoking status: Never   Smokeless tobacco: Never  Vaping Use   Vaping Use: Never used  Substance and Sexual Activity   Alcohol use: Not Currently    Comment: wine occasionally couple times a year   Drug use: Never   Sexual activity: Not Currently  Other Topics Concern   Not on file  Social History Narrative   Right  handed    Caffeine 2 cups per day.    Social Determinants of Health   Financial Resource Strain: Not on file  Food Insecurity: Not on file  Transportation Needs: Not on file  Physical Activity: Not on file  Stress: Not on file  Social Connections: Not on file  Intimate Partner Violence: Not  on file    ROS  PHYSICAL EXAMINATION:    There were no vitals taken for this visit.    General appearance: alert, cooperative and appears stated age  84. Perimenopausal Discussed use of provera, reassured her that her w/u was benign We also discussed calcium and vit d requirements Routine f/u

## 2022-07-18 ENCOUNTER — Telehealth (INDEPENDENT_AMBULATORY_CARE_PROVIDER_SITE_OTHER): Payer: BC Managed Care – PPO | Admitting: Obstetrics and Gynecology

## 2022-07-18 ENCOUNTER — Encounter: Payer: Self-pay | Admitting: Obstetrics and Gynecology

## 2022-07-18 DIAGNOSIS — N951 Menopausal and female climacteric states: Secondary | ICD-10-CM

## 2022-07-28 ENCOUNTER — Encounter: Payer: Self-pay | Admitting: Obstetrics and Gynecology

## 2022-08-25 ENCOUNTER — Encounter: Payer: Self-pay | Admitting: Obstetrics and Gynecology
